# Patient Record
Sex: Female | Born: 1945 | Race: White | Hispanic: No | Marital: Married | Smoking: Former smoker
Health system: Southern US, Community
[De-identification: ages and names within clinical notes are randomized; demographics above are authoritative.]

## PROBLEM LIST (undated history)

## (undated) DIAGNOSIS — R002 Palpitations: Secondary | ICD-10-CM

## (undated) DIAGNOSIS — J449 Chronic obstructive pulmonary disease, unspecified: Secondary | ICD-10-CM

## (undated) DIAGNOSIS — E559 Vitamin D deficiency, unspecified: Secondary | ICD-10-CM

## (undated) DIAGNOSIS — G4733 Obstructive sleep apnea (adult) (pediatric): Secondary | ICD-10-CM

## (undated) DIAGNOSIS — M1990 Unspecified osteoarthritis, unspecified site: Secondary | ICD-10-CM

## (undated) DIAGNOSIS — M797 Fibromyalgia: Secondary | ICD-10-CM

## (undated) DIAGNOSIS — D1803 Hemangioma of intra-abdominal structures: Secondary | ICD-10-CM

## (undated) DIAGNOSIS — E785 Hyperlipidemia, unspecified: Secondary | ICD-10-CM

## (undated) DIAGNOSIS — G971 Other reaction to spinal and lumbar puncture: Secondary | ICD-10-CM

## (undated) DIAGNOSIS — K760 Fatty (change of) liver, not elsewhere classified: Secondary | ICD-10-CM

## (undated) DIAGNOSIS — G4730 Sleep apnea, unspecified: Secondary | ICD-10-CM

## (undated) DIAGNOSIS — N3090 Cystitis, unspecified without hematuria: Secondary | ICD-10-CM

## (undated) DIAGNOSIS — I4510 Unspecified right bundle-branch block: Secondary | ICD-10-CM

## (undated) DIAGNOSIS — T4145XA Adverse effect of unspecified anesthetic, initial encounter: Secondary | ICD-10-CM

## (undated) DIAGNOSIS — K219 Gastro-esophageal reflux disease without esophagitis: Secondary | ICD-10-CM

## (undated) DIAGNOSIS — Z8489 Family history of other specified conditions: Secondary | ICD-10-CM

## (undated) DIAGNOSIS — R7303 Prediabetes: Secondary | ICD-10-CM

## (undated) DIAGNOSIS — F419 Anxiety disorder, unspecified: Secondary | ICD-10-CM

## (undated) DIAGNOSIS — N83201 Unspecified ovarian cyst, right side: Secondary | ICD-10-CM

## (undated) DIAGNOSIS — T8859XA Other complications of anesthesia, initial encounter: Secondary | ICD-10-CM

## (undated) DIAGNOSIS — Z91014 Allergy to mammalian meats: Secondary | ICD-10-CM

## (undated) DIAGNOSIS — I10 Essential (primary) hypertension: Secondary | ICD-10-CM

## (undated) DIAGNOSIS — J302 Other seasonal allergic rhinitis: Secondary | ICD-10-CM

## (undated) DIAGNOSIS — K635 Polyp of colon: Secondary | ICD-10-CM

## (undated) DIAGNOSIS — F32A Depression, unspecified: Secondary | ICD-10-CM

## (undated) DIAGNOSIS — R001 Bradycardia, unspecified: Secondary | ICD-10-CM

## (undated) DIAGNOSIS — I2510 Atherosclerotic heart disease of native coronary artery without angina pectoris: Secondary | ICD-10-CM

## (undated) DIAGNOSIS — K2950 Unspecified chronic gastritis without bleeding: Secondary | ICD-10-CM

## (undated) DIAGNOSIS — I700 Atherosclerosis of aorta: Secondary | ICD-10-CM

## (undated) DIAGNOSIS — J45909 Unspecified asthma, uncomplicated: Secondary | ICD-10-CM

## (undated) HISTORY — PX: OTHER SURGICAL HISTORY: SHX169

## (undated) HISTORY — PX: BREAST BIOPSY: SHX20

## (undated) HISTORY — PX: DILATION AND CURETTAGE OF UTERUS: SHX78

## (undated) HISTORY — PX: NEUROMA SURGERY: SHX722

## (undated) HISTORY — PX: CHOLECYSTECTOMY: SHX55

## (undated) HISTORY — PX: HAND SURGERY: SHX662

---
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
## 2005-03-18 ENCOUNTER — Ambulatory Visit: Payer: Self-pay | Admitting: Unknown Physician Specialty

## 2008-07-24 ENCOUNTER — Ambulatory Visit: Payer: Self-pay | Admitting: Family Medicine

## 2008-07-30 ENCOUNTER — Ambulatory Visit: Payer: Self-pay | Admitting: Family Medicine

## 2008-08-08 ENCOUNTER — Ambulatory Visit: Payer: Self-pay | Admitting: Family Medicine

## 2009-07-13 ENCOUNTER — Ambulatory Visit: Payer: Self-pay | Admitting: Family Medicine

## 2010-06-04 ENCOUNTER — Ambulatory Visit: Payer: Self-pay | Admitting: Unknown Physician Specialty

## 2010-09-23 ENCOUNTER — Emergency Department: Payer: Self-pay | Admitting: Emergency Medicine

## 2011-11-23 ENCOUNTER — Ambulatory Visit: Payer: Self-pay | Admitting: Internal Medicine

## 2012-05-30 ENCOUNTER — Ambulatory Visit: Payer: Self-pay | Admitting: Internal Medicine

## 2012-06-01 ENCOUNTER — Ambulatory Visit: Payer: Self-pay | Admitting: Internal Medicine

## 2014-06-26 ENCOUNTER — Ambulatory Visit: Payer: Self-pay | Admitting: Internal Medicine

## 2014-07-04 ENCOUNTER — Ambulatory Visit: Payer: Self-pay | Admitting: Internal Medicine

## 2014-07-09 ENCOUNTER — Ambulatory Visit: Payer: Self-pay | Admitting: Internal Medicine

## 2014-12-08 ENCOUNTER — Ambulatory Visit: Payer: Self-pay | Admitting: Internal Medicine

## 2015-03-18 ENCOUNTER — Other Ambulatory Visit: Payer: Self-pay | Admitting: Internal Medicine

## 2015-03-18 DIAGNOSIS — R102 Pelvic and perineal pain: Secondary | ICD-10-CM

## 2015-03-25 ENCOUNTER — Ambulatory Visit
Admission: RE | Admit: 2015-03-25 | Discharge: 2015-03-25 | Disposition: A | Discharge status: Home / Self Care | Payer: Medicare Other | Source: Ambulatory Visit | Attending: Internal Medicine | Admitting: Internal Medicine

## 2015-03-25 DIAGNOSIS — D259 Leiomyoma of uterus, unspecified: Secondary | ICD-10-CM | POA: Diagnosis not present

## 2015-03-25 DIAGNOSIS — N9489 Other specified conditions associated with female genital organs and menstrual cycle: Secondary | ICD-10-CM | POA: Insufficient documentation

## 2015-03-25 DIAGNOSIS — R102 Pelvic and perineal pain: Secondary | ICD-10-CM | POA: Diagnosis present

## 2015-05-22 ENCOUNTER — Other Ambulatory Visit
Admission: RE | Admit: 2015-05-22 | Discharge: 2015-05-22 | Disposition: A | Discharge status: Home / Self Care | Payer: Medicare Other | Source: Ambulatory Visit | Attending: Unknown Physician Specialty | Admitting: Unknown Physician Specialty

## 2015-05-22 DIAGNOSIS — R197 Diarrhea, unspecified: Secondary | ICD-10-CM | POA: Diagnosis present

## 2015-05-22 LAB — C DIFFICILE QUICK SCREEN W PCR REFLEX
C DIFFICLE (CDIFF) ANTIGEN: NEGATIVE
C Diff interpretation: NEGATIVE
C Diff toxin: NEGATIVE

## 2015-05-24 LAB — STOOL CULTURE

## 2015-05-27 LAB — GIARDIA, EIA; OVA/PARASITE: Giardia Ag, Stl: NEGATIVE

## 2015-05-27 LAB — O&P RESULT

## 2015-06-12 ENCOUNTER — Encounter: Payer: Self-pay | Admitting: *Deleted

## 2015-06-15 ENCOUNTER — Encounter
Admission: RE | Disposition: A | Discharge status: Home / Self Care | Payer: Self-pay | Source: Ambulatory Visit | Attending: Unknown Physician Specialty

## 2015-06-15 ENCOUNTER — Ambulatory Visit: Payer: Medicare Other | Admitting: Anesthesiology

## 2015-06-15 ENCOUNTER — Ambulatory Visit
Admission: RE | Admit: 2015-06-15 | Discharge: 2015-06-15 | Disposition: A | Discharge status: Home / Self Care | Payer: Medicare Other | Source: Ambulatory Visit | Attending: Unknown Physician Specialty | Admitting: Unknown Physician Specialty

## 2015-06-15 DIAGNOSIS — K529 Noninfective gastroenteritis and colitis, unspecified: Secondary | ICD-10-CM | POA: Insufficient documentation

## 2015-06-15 DIAGNOSIS — Z87891 Personal history of nicotine dependence: Secondary | ICD-10-CM | POA: Insufficient documentation

## 2015-06-15 DIAGNOSIS — D125 Benign neoplasm of sigmoid colon: Secondary | ICD-10-CM | POA: Diagnosis not present

## 2015-06-15 DIAGNOSIS — D122 Benign neoplasm of ascending colon: Secondary | ICD-10-CM | POA: Diagnosis not present

## 2015-06-15 DIAGNOSIS — M1990 Unspecified osteoarthritis, unspecified site: Secondary | ICD-10-CM | POA: Diagnosis not present

## 2015-06-15 DIAGNOSIS — I10 Essential (primary) hypertension: Secondary | ICD-10-CM | POA: Diagnosis not present

## 2015-06-15 DIAGNOSIS — K640 First degree hemorrhoids: Secondary | ICD-10-CM | POA: Diagnosis not present

## 2015-06-15 DIAGNOSIS — Z79899 Other long term (current) drug therapy: Secondary | ICD-10-CM | POA: Insufficient documentation

## 2015-06-15 DIAGNOSIS — J45909 Unspecified asthma, uncomplicated: Secondary | ICD-10-CM | POA: Insufficient documentation

## 2015-06-15 HISTORY — DX: Unspecified asthma, uncomplicated: J45.909

## 2015-06-15 HISTORY — DX: Unspecified osteoarthritis, unspecified site: M19.90

## 2015-06-15 HISTORY — DX: Essential (primary) hypertension: I10

## 2015-06-15 HISTORY — PX: COLONOSCOPY WITH PROPOFOL: SHX5780

## 2015-06-15 SURGERY — COLONOSCOPY WITH PROPOFOL
Anesthesia: General

## 2015-06-15 MED ORDER — SODIUM CHLORIDE 0.9 % IV SOLN
INTRAVENOUS | Status: DC
  Administered 2015-06-15: 16:00:00 via INTRAVENOUS

## 2015-06-15 MED ORDER — SODIUM CHLORIDE 0.9 % IV SOLN
INTRAVENOUS | Status: DC
  Administered 2015-06-15: 1000 mL via INTRAVENOUS

## 2015-06-15 MED ORDER — ONDANSETRON HCL 4 MG/2ML IJ SOLN
INTRAMUSCULAR | Status: AC
  Administered 2015-06-15: 4 mg
  Filled 2015-06-15: qty 2

## 2015-06-15 MED ORDER — PROMETHAZINE HCL 25 MG/ML IJ SOLN
INTRAMUSCULAR | Status: AC
  Filled 2015-06-15: qty 1

## 2015-06-15 MED ORDER — ONDANSETRON HCL 4 MG/2ML IJ SOLN
INTRAMUSCULAR | Status: AC
  Filled 2015-06-15: qty 2

## 2015-06-15 MED ORDER — PROPOFOL 10 MG/ML IV BOLUS
INTRAVENOUS | Status: DC | PRN
  Administered 2015-06-15: 20 mg via INTRAVENOUS

## 2015-06-15 MED ORDER — FENTANYL CITRATE (PF) 100 MCG/2ML IJ SOLN
INTRAMUSCULAR | Status: DC | PRN
  Administered 2015-06-15: 25 ug via INTRAVENOUS
  Administered 2015-06-15: 50 ug via INTRAVENOUS
  Administered 2015-06-15: 25 ug via INTRAVENOUS
  Administered 2015-06-15: 100 ug via INTRAVENOUS

## 2015-06-15 MED ORDER — PROPOFOL INFUSION 10 MG/ML OPTIME
INTRAVENOUS | Status: DC | PRN
  Administered 2015-06-15: 50 ug/kg/min via INTRAVENOUS

## 2015-06-15 MED ORDER — PHENYLEPHRINE HCL 10 MG/ML IJ SOLN
INTRAMUSCULAR | Status: DC | PRN
  Administered 2015-06-15 (×5): 50 ug via INTRAVENOUS

## 2015-06-15 MED ORDER — MIDAZOLAM HCL 2 MG/2ML IJ SOLN
INTRAMUSCULAR | Status: DC | PRN
  Administered 2015-06-15: 0.5 mg via INTRAVENOUS
  Administered 2015-06-15: 1 mg via INTRAVENOUS
  Administered 2015-06-15: 0.5 mg via INTRAVENOUS
  Administered 2015-06-15: 2 mg via INTRAVENOUS

## 2015-06-15 NOTE — Transfer of Care (Signed)
Immediate Anesthesia Transfer of Care Note  Patient: Sarah Harrison  Procedure(s) Performed: Procedure(s): COLONOSCOPY WITH PROPOFOL (N/A)  Patient Location: PACU  Anesthesia Type:General  Level of Consciousness: awake, alert  and oriented  Airway & Oxygen Therapy: Patient connected to nasal cannula oxygen  Post-op Assessment: Report given to RN, Post -op Vital signs reviewed and stable and Patient moving all extremities  Post vital signs: Reviewed and stable  Last Vitals:  Filed Vitals:   06/15/15 1311  BP: 106/56  Pulse: 93  Temp: 36.7 C  Resp: 20    Complications: No apparent anesthesia complications

## 2015-06-15 NOTE — H&P (Signed)
   Primary Care Physician:  Idelle Crouch, MD Primary Gastroenterologist:  Dr. Vira Agar  Pre-Procedure History & Physical: HPI:  Sarah Harrison is a 69 y.o. female is here for an colonoscopy.   Past Medical History  Diagnosis Date  . Asthma   . Arthritis   . Hypertension     Past Surgical History  Procedure Laterality Date  . Cholecystectomy      Prior to Admission medications   Medication Sig Start Date End Date Taking? Authorizing Provider  albuterol (PROVENTIL HFA;VENTOLIN HFA) 108 (90 BASE) MCG/ACT inhaler Inhale 2 puffs into the lungs every 6 (six) hours as needed for wheezing or shortness of breath.   Yes Historical Provider, MD  budesonide (PULMICORT) 0.25 MG/2ML nebulizer solution Take 0.25 mg by nebulization 2 (two) times daily.   Yes Historical Provider, MD  calcium carbonate (TUMS EX) 750 MG chewable tablet Chew 1 tablet by mouth daily.   Yes Historical Provider, MD  Cholecalciferol 2000 UNITS CAPS Take 2,000 Units by mouth daily.   Yes Historical Provider, MD  EPINEPHrine (EPIPEN 2-PAK) 0.3 mg/0.3 mL IJ SOAJ injection Inject 0.3 mg into the muscle once. USE AS DIRECTED   Yes Historical Provider, MD  lisinopril-hydrochlorothiazide (PRINZIDE,ZESTORETIC) 10-12.5 MG per tablet Take 1 tablet by mouth daily.   Yes Historical Provider, MD    Allergies as of 04/15/2015  . (Not on File)    History reviewed. No pertinent family history.  Social History   Social History  . Marital Status: Married    Spouse Name: N/A  . Number of Children: N/A  . Years of Education: N/A   Occupational History  . Not on file.   Social History Main Topics  . Smoking status: Never Smoker   . Smokeless tobacco: Never Used  . Alcohol Use: 8.4 oz/week    7 Glasses of wine, 7 Shots of liquor per week  . Drug Use: No  . Sexual Activity: Not on file   Other Topics Concern  . Not on file   Social History Narrative  . No narrative on file    Review of Systems: See HPI,  otherwise negative ROS  Physical Exam: BP 106/56 mmHg  Pulse 93  Temp(Src) 98 F (36.7 C) (Oral)  Resp 20  Ht 5\' 4"  (1.626 m)  Wt 67.586 kg (149 lb)  BMI 25.56 kg/m2  SpO2 100% General:   Alert,  pleasant and cooperative in NAD Head:  Normocephalic and atraumatic. Neck:  Supple; no masses or thyromegaly. Lungs:  Clear throughout to auscultation.    Heart:  Regular rate and rhythm. Abdomen:  Soft, nontender and nondistended. Normal bowel sounds, without guarding, and without rebound.   Neurologic:  Alert and  oriented x4;  grossly normal neurologically.  Impression/Plan: Sarah Harrison is here for an colonoscopy to be performed for chronic diarrhea  Risks, benefits, limitations, and alternatives regarding  colonoscopy have been reviewed with the patient.  Questions have been answered.  All parties agreeable.   Gaylyn Cheers, MD  06/15/2015, 2:30 PM

## 2015-06-15 NOTE — Op Note (Signed)
[facility] Gastroenterology Patient Name: Sarah Harrison Procedure Date: 06/15/2015 2:25 PM MRN: [id number] Account #: [id number] Date of Birth: Dec 27, 1945 Admit Type: Outpatient Age: 69 Room: [facility] ENDO ROOM 1 Gender: Female Note Status: Finalized Procedure:         Colonoscopy Indications:       Chronic diarrhea, Clinically significant diarrhea of                     unexplained origin Providers:         Manya Silvas, MD Referring MD:      Leonie Douglas. Doy Hutching, MD (Referring MD) Medicines:         Propofol per Anesthesia Complications:     No immediate complications. Procedure:         Pre-Anesthesia Assessment:                    - After reviewing the risks and benefits, the patient was                     deemed in satisfactory condition to undergo the procedure.                    After obtaining informed consent, the colonoscope was                     passed under direct vision. Throughout the procedure, the                     patient's blood pressure, pulse, and oxygen saturations                     were monitored continuously. The Colonoscope was                     introduced through the anus and advanced to the the cecum,                     identified by appendiceal orifice and ileocecal valve. The                     colonoscopy was somewhat difficult due to restricted                     mobility of the colon. Successful completion of the                     procedure was aided by applying abdominal pressure. The                     patient tolerated the procedure well. The quality of the                     bowel preparation was excellent. Findings:      Three sessile polyps were found in the sigmoid colon and in the       ascending colon. The polyps were diminutive in size. These polyps were       removed with a cold biopsy forceps. Resection and retrieval were       complete.      Internal hemorrhoids were found during endoscopy. The  hemorrhoids were       small and Grade I (internal hemorrhoids that do not prolapse).      The exam was otherwise without abnormality. Due tto  chronic diarrhea I       biopsied the ascending, transverse, descending,, sigmod and rectum Impression:        - Three diminutive polyps in the sigmoid colon and in the                     ascending colon. Resected and retrieved.                    - Internal hemorrhoids.                    - The examination was otherwise normal. Recommendation:    - Await pathology results. Manya Silvas, MD 06/15/2015 3:06:24 PM This report has been signed electronically. Number of Addenda: 0 Note Initiated On: 06/15/2015 2:25 PM Scope Withdrawal Time: 0 hours 10 minutes 1 second  Total Procedure Duration: 0 hours 26 minutes 34 seconds       [facility]

## 2015-06-15 NOTE — Anesthesia Postprocedure Evaluation (Signed)
  Anesthesia Post-op Note  Patient: Sarah Harrison  Procedure(s) Performed: Procedure(s): COLONOSCOPY WITH PROPOFOL (N/A)  Anesthesia type:General  Patient location: PACU  Post pain: Pain level controlled  Post assessment: Post-op Vital signs reviewed, Patient's Cardiovascular Status Stable, Respiratory Function Stable, Patent Airway and No signs of Nausea or vomiting  Post vital signs: Reviewed and stable  Last Vitals:  Filed Vitals:   06/15/15 1555  BP: 148/66  Pulse:   Temp:   Resp:     Level of consciousness: awake, alert  and patient cooperative  Complications: No apparent anesthesia complications

## 2015-06-15 NOTE — Anesthesia Preprocedure Evaluation (Signed)
Anesthesia Evaluation  Patient identified by MRN, date of birth, ID band Patient awake    Reviewed: Allergy & Precautions, NPO status , Patient's Chart, lab work & pertinent test results  Airway Mallampati: III       Dental no notable dental hx.    Pulmonary asthma , former smoker,    Pulmonary exam normal       Cardiovascular hypertension, Pt. on medications Normal cardiovascular exam    Neuro/Psych    GI/Hepatic negative GI ROS, Neg liver ROS,   Endo/Other    Renal/GU negative Renal ROS     Musculoskeletal   Abdominal Normal abdominal exam  (+)   Peds  Hematology negative hematology ROS (+)   Anesthesia Other Findings   Reproductive/Obstetrics                             Anesthesia Physical Anesthesia Plan  ASA: II  Anesthesia Plan: General   Post-op Pain Management:    Induction: Intravenous  Airway Management Planned: Nasal Cannula  Additional Equipment:   Intra-op Plan:   Post-operative Plan:   Informed Consent: I have reviewed the patients History and Physical, chart, labs and discussed the procedure including the risks, benefits and alternatives for the proposed anesthesia with the patient or authorized representative who has indicated his/her understanding and acceptance.     Plan Discussed with: CRNA  Anesthesia Plan Comments:         Anesthesia Quick Evaluation

## 2015-06-16 ENCOUNTER — Encounter: Payer: Self-pay | Admitting: Unknown Physician Specialty

## 2015-06-17 LAB — SURGICAL PATHOLOGY

## 2016-04-11 DIAGNOSIS — N83201 Unspecified ovarian cyst, right side: Secondary | ICD-10-CM | POA: Insufficient documentation

## 2017-06-12 ENCOUNTER — Other Ambulatory Visit: Payer: Self-pay | Admitting: Internal Medicine

## 2017-06-12 DIAGNOSIS — E559 Vitamin D deficiency, unspecified: Secondary | ICD-10-CM | POA: Insufficient documentation

## 2017-06-12 DIAGNOSIS — R079 Chest pain, unspecified: Secondary | ICD-10-CM

## 2017-06-19 ENCOUNTER — Ambulatory Visit
Admission: RE | Admit: 2017-06-19 | Discharge: 2017-06-19 | Disposition: A | Discharge status: Home / Self Care | Payer: Medicare Other | Source: Ambulatory Visit | Attending: Internal Medicine | Admitting: Internal Medicine

## 2017-06-19 DIAGNOSIS — R079 Chest pain, unspecified: Secondary | ICD-10-CM | POA: Diagnosis present

## 2017-06-19 DIAGNOSIS — I700 Atherosclerosis of aorta: Secondary | ICD-10-CM | POA: Insufficient documentation

## 2017-06-19 DIAGNOSIS — D1803 Hemangioma of intra-abdominal structures: Secondary | ICD-10-CM | POA: Diagnosis not present

## 2017-06-19 DIAGNOSIS — R932 Abnormal findings on diagnostic imaging of liver and biliary tract: Secondary | ICD-10-CM | POA: Diagnosis not present

## 2017-06-19 MED ORDER — IOPAMIDOL (ISOVUE-370) INJECTION 76%
100.0000 mL | Freq: Once | INTRAVENOUS | Status: AC | PRN
  Administered 2017-06-19: 100 mL via INTRAVENOUS

## 2017-09-28 DIAGNOSIS — I10 Essential (primary) hypertension: Secondary | ICD-10-CM | POA: Insufficient documentation

## 2017-11-06 ENCOUNTER — Encounter: Payer: Self-pay | Admitting: Pulmonary Disease

## 2017-11-06 ENCOUNTER — Ambulatory Visit: Payer: Medicare Other | Admitting: Pulmonary Disease

## 2017-11-06 VITALS — BP 124/80 | HR 71 | Resp 16 | Ht 64.0 in | Wt 160.0 lb

## 2017-11-06 DIAGNOSIS — R053 Chronic cough: Secondary | ICD-10-CM

## 2017-11-06 DIAGNOSIS — Z87891 Personal history of nicotine dependence: Secondary | ICD-10-CM | POA: Diagnosis not present

## 2017-11-06 DIAGNOSIS — Z8709 Personal history of other diseases of the respiratory system: Secondary | ICD-10-CM

## 2017-11-06 DIAGNOSIS — R911 Solitary pulmonary nodule: Secondary | ICD-10-CM

## 2017-11-06 DIAGNOSIS — R05 Cough: Secondary | ICD-10-CM

## 2017-11-06 DIAGNOSIS — K219 Gastro-esophageal reflux disease without esophagitis: Secondary | ICD-10-CM | POA: Diagnosis not present

## 2017-11-06 DIAGNOSIS — R918 Other nonspecific abnormal finding of lung field: Secondary | ICD-10-CM

## 2017-11-06 MED ORDER — HYDROCOD POLST-CPM POLST ER 10-8 MG/5ML PO SUER: 5.0000 mL | Freq: Two times a day (BID) | ORAL | 0 refills | Status: DC | PRN

## 2017-11-06 MED ORDER — BENZONATATE 200 MG PO CAPS: 200.0000 mg | ORAL_CAPSULE | Freq: Three times a day (TID) | ORAL | 5 refills | Status: DC | PRN

## 2017-11-06 MED ORDER — BUDESONIDE-FORMOTEROL FUMARATE 160-4.5 MCG/ACT IN AERO: 2.0000 | INHALATION_SPRAY | Freq: Two times a day (BID) | RESPIRATORY_TRACT | 12 refills | Status: DC

## 2017-11-06 NOTE — Progress Notes (Signed)
PULMONARY CONSULT NOTE  Requesting MD/Service: Self, primary MD: Sparks Date of initial consultation: 11/06/17 Reason for consultation: Cough  PT PROFILE: 72 y.o. female never smoker self-referred for cough of 3 months duration   DATA: 06/19/17 CTA chest: No PE. Four mm right lower lobe pulmonary nodule, new since 2015   HPI:  As above. She initially developed a cough in Sept 2018 with an URI like illness (malaise, subjective fever, cough with mucus production).  All of the symptoms resolved except persistent cough.  She has been seen by Dr. Doy Hutching on a couple of occasions and has been prescribed 2 courses of antibiotics and prednisone.  She is also been prescribed 2 different inhalers.  Presently, her cough is worse at night.  On the day prior to this encounter she had no cough at all.  She does have intermittent hoarseness.  She describes her cough is rattling but she is not expectorating any mucus.  She denies hemoptysis.  She describes a foul taste in in her throat and mouth.  She denies fever and chest pain.  She has intermittent chest tightness.  She was on an ACE inhibitor but this was discontinued approximately 2 months ago with no improvement in her cough.  She has been seen by ENT and underwent reportedly laryngoscopy which was normal.  She is a former smoker and quit many years ago  (1987).  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  Many years ago she was seen by Dr. Ileene Musa and at that time was treated for possible asthma.  She also has a history of pneumonia as a child.  Past Medical History:  Diagnosis Date  . Arthritis   . Asthma   . Hypertension     Past Surgical History:  Procedure Laterality Date  . CHOLECYSTECTOMY    . COLONOSCOPY WITH PROPOFOL N/A 06/15/2015   Procedure: COLONOSCOPY WITH PROPOFOL;  Surgeon: Manya Silvas, MD;  Location: [facility];  Service: Endoscopy;  Laterality: N/A;    MEDICATIONS: I have  reviewed all medications and confirmed regimen as documented  Social History   Socioeconomic History  . Marital status: Married    Spouse name: Not on file  . Number of children: Not on file  . Years of education: Not on file  . Highest education level: Not on file  Social Needs  . Financial resource strain: Not on file  . Food insecurity - worry: Not on file  . Food insecurity - inability: Not on file  . Transportation needs - medical: Not on file  . Transportation needs - non-medical: Not on file  Occupational History  . Not on file  Tobacco Use  . Smoking status: Never Smoker  . Smokeless tobacco: Never Used  Substance and Sexual Activity  . Alcohol use: Yes    Alcohol/week: 8.4 oz    Types: 7 Glasses of wine, 7 Shots of liquor per week  . Drug use: No  . Sexual activity: Not on file  Other Topics Concern  . Not on file  Social History Narrative  . Not on file    History reviewed. No pertinent family history.  ROS: No fever, myalgias/arthralgias, unexplained weight loss or weight gain No new focal weakness or sensory deficits No otalgia, hearing loss, visual changes, nasal and sinus symptoms, mouth and throat problems No neck pain or adenopathy No abdominal pain, N/V/D, diarrhea, change in bowel pattern No dysuria, change in urinary pattern   Vitals:  11/06/17 1330 11/06/17 1333  BP:  124/80  Pulse:  71  Resp: 16   SpO2:  98%  Weight: 72.6 kg (160 lb)   Height: 5\' 4"  (1.626 m)   Room air   EXAM:  Gen: WDWN in NAD HEENT: NCAT, sclerae white, oropharynx normal Neck: No LAN, no JVD noted Lungs: full BS, normal percussion note throughout, no adventitious sounds Cardiovascular: Regular, normal rate, no M noted Abdomen: Soft, NT, +BS Ext: no C/C/E Neuro: PERRL, EOMI, motor/sensory grossly intact Skin: No lesions noted    DATA:   No flowsheet data found.  No flowsheet data found.  CXR: None available  IMPRESSION:     ICD-10-CM   1. Chronic  cough R05 Pulmonary Function Test [facility] Only  2. Former smoker Z87.891 Pulmonary Function Test [facility] Only  3. Lung nodule R91.1   4. GERD K21.9   5. ? history of asthma Z87.09 Pulmonary Function Test [facility] Only    Suspect cyclical component to cough  PLAN:  -Continue Spiriva and omeprazole as currently prescribed -I have added Symbicort inhaler, 2 actuations twice daily. Aggressive cough suppression as follows: -Tussionex-1 teaspoon at bedtime for 5 days, then every 12 hours as needed after that -Tessalon Perles, 200 mg every 4 hours as needed for cough -Follow-up in 4 weeks with PFTs prior to that visit -A CT chest has been ordered to follow the incidental finding of RLL nodule noted on CT chest in August 2018   Merton Border, MD PCCM service Mobile [phone] Pager [phone] 11/09/2017 10:51 PM

## 2017-11-06 NOTE — Patient Instructions (Signed)
Continue Spiriva and omeprazole as currently prescribed We will add Symbicort inhaler -2 actuations twice a day.  Rinse mouth after use Aggressive cough suppression as follows: Tussionex -use 1 teaspoon at bedtime for 5 straight nights and may use once a day in addition to this as needed  Then use every 12 hours as needed for cough Tessalon Perles -use every 4 hours as needed for cough  Repeat CT chest ordered Lung function tests (PFTs) ordered  Follow-up in 4 weeks

## 2017-11-07 ENCOUNTER — Telehealth: Payer: Self-pay | Admitting: Pulmonary Disease

## 2017-11-07 NOTE — Telephone Encounter (Signed)
Message left

## 2017-11-07 NOTE — Telephone Encounter (Signed)
Yes, absolutely, emphatically yes!!  Sarah Harrison

## 2017-11-07 NOTE — Telephone Encounter (Signed)
Patient wants to know if it is ok to take the pneumonia vaccine   Please call

## 2017-11-14 ENCOUNTER — Ambulatory Visit
Admission: RE | Admit: 2017-11-14 | Discharge: 2017-11-14 | Disposition: A | Discharge status: Home / Self Care | Payer: Medicare Other | Source: Ambulatory Visit | Attending: Pulmonary Disease | Admitting: Pulmonary Disease

## 2017-11-14 ENCOUNTER — Ambulatory Visit: Payer: Medicare Other | Attending: Pulmonary Disease

## 2017-11-14 DIAGNOSIS — Z87891 Personal history of nicotine dependence: Secondary | ICD-10-CM

## 2017-11-14 DIAGNOSIS — R911 Solitary pulmonary nodule: Secondary | ICD-10-CM | POA: Insufficient documentation

## 2017-11-14 DIAGNOSIS — I700 Atherosclerosis of aorta: Secondary | ICD-10-CM | POA: Diagnosis not present

## 2017-11-14 DIAGNOSIS — Z8709 Personal history of other diseases of the respiratory system: Secondary | ICD-10-CM

## 2017-11-14 DIAGNOSIS — R05 Cough: Secondary | ICD-10-CM | POA: Insufficient documentation

## 2017-11-14 DIAGNOSIS — R918 Other nonspecific abnormal finding of lung field: Secondary | ICD-10-CM

## 2017-11-14 DIAGNOSIS — R053 Chronic cough: Secondary | ICD-10-CM

## 2017-11-24 ENCOUNTER — Ambulatory Visit: Payer: Medicare Other | Admitting: Internal Medicine

## 2017-11-24 ENCOUNTER — Encounter: Payer: Self-pay | Admitting: Pulmonary Disease

## 2017-11-24 VITALS — BP 118/66 | HR 80 | Ht 64.0 in | Wt 161.0 lb

## 2017-11-24 DIAGNOSIS — R053 Chronic cough: Secondary | ICD-10-CM

## 2017-11-24 DIAGNOSIS — R918 Other nonspecific abnormal finding of lung field: Secondary | ICD-10-CM | POA: Diagnosis not present

## 2017-11-24 DIAGNOSIS — R911 Solitary pulmonary nodule: Secondary | ICD-10-CM | POA: Diagnosis not present

## 2017-11-24 DIAGNOSIS — R05 Cough: Secondary | ICD-10-CM

## 2017-11-24 NOTE — Patient Instructions (Signed)
-  Continue to use Astelin nasal spray for sinus symptoms, if this does not work, switch to [facility].  - We will check a CT chest in approximately 1 year, follow-up with Dr. Alva Garnet after that.  -In the meantime call us back with any further respiratory symptoms.  You can stay off of your inhalers.

## 2017-11-24 NOTE — Progress Notes (Signed)
Requesting MD/Service: Self, primary MD: Doy Hutching Date of initial consultation: 11/06/17 Reason for consultation: Cough  PT PROFILE: 72 y.o. female never smoker self-referred for cough of 3 months duration   DATA: 06/19/17 CTA chest: No PE. Four mm right lower lobe pulmonary nodule, new since 2015   HPI:  Patient coming in as a follow-up from her recent appointment with Dr. Alva Garnet for chronic cough.  She had been started on Spiriva and Symbicort, when she saw Dr. Alva Garnet she was given anti-cough medications including Tessalon and Tussionex.  She did not notice any significant improvement in the cough, she was subsequently sent for CT chest due to nodule, and pulmonary function testing.  These tests notes showed no significant abnormality. In the interim she decided to "stop everything" including her cough medicines and her inhalers.  She noted that the cough immediately went away and has not come back since that time.  She continues to have some sinus symptoms with sinus pressure on her right cheek bone, she has no other particular complaints at this time.  Imaging personally reviewed, CT chest 11/14/17, normal lungs, redemonstration of previously seen right lower lobe nodule which is not significantly changed. -Review of pulmonary function testing; normal lung functions, without evidence of COPD or emphysema.  MEDICATIONS: I have reviewed all medications and confirmed regimen as documented  Social History   Socioeconomic History  . Marital status: Married    Spouse name: Not on file  . Number of children: Not on file  . Years of education: Not on file  . Highest education level: Not on file  Social Needs  . Financial resource strain: Not on file  . Food insecurity - worry: Not on file  . Food insecurity - inability: Not on file  . Transportation needs - medical: Not on file  . Transportation needs - non-medical: Not on file  Occupational History  . Not on file  Tobacco Use  .  Smoking status: Never Smoker  . Smokeless tobacco: Never Used  Substance and Sexual Activity  . Alcohol use: Yes    Alcohol/week: 8.4 oz    Types: 7 Glasses of wine, 7 Shots of liquor per week  . Drug use: No  . Sexual activity: Not on file  Other Topics Concern  . Not on file  Social History Narrative  . Not on file    History reviewed. No pertinent family history.  ROS: No fever, myalgias/arthralgias, unexplained weight loss or weight gain No new focal weakness or sensory deficits No otalgia, hearing loss, visual changes, nasal symptoms, mouth and throat problems No neck pain or adenopathy No abdominal pain, N/V/D, diarrhea, change in bowel pattern No dysuria, change in urinary pattern   Vitals:   11/24/17 0839 11/24/17 0844  BP:  118/66  Pulse:  80  SpO2:  97%  Weight: 161 lb (73 kg)   Height: 5\' 4"  (1.626 m)   Room air   EXAM:  Gen: WDWN in NAD HEENT: NCAT, sclerae white, oropharynx normal Neck: No LAN, no JVD noted Lungs: full BS, normal percussion note throughout, no adventitious sounds Cardiovascular: Regular, normal rate, no M noted Abdomen: Soft, NT, +BS Ext: no C/C/E Neuro: PERRL, EOMI, motor/sensory grossly intact Skin: No lesions noted    DATA:   No flowsheet data found.  No flowsheet data found.  CXR: None available  IMPRESSION:     ICD-10-CM   1. Chronic cough R05   2. Former smoker Z87.891   3. Lung nodule R91.1  CT chest in 1 year.   4. GERD K21.9   5. ? history of asthma D22.02     Suspect cyclical component to cough  PLAN:  -Cough appears to have resolved after stopping inhalers, will continue to hold them.  Continue to hold anticough medications including Tessalon and Tussionex as it did not appear to help. -CT chest continues to show small right lower lobe nodule, will repeat in 1 year, and follow-up with Dr. Alva Garnet after that. - Symptoms of sinus pressure today which could be a sinus infection.  We discussed that this is most  likely viral, continue to monitor for 1 week if not improved in 1 week call us back or ENT.   Marda Stalker, MD.   Board Certified in Internal Medicine, Pulmonary Medicine, [place], and Sleep Medicine.  [facility] Office Number: [phone] Pager: [phone]  Patricia Pesa, M.D.  Merton Border, M.D  11/24/2017 9:03 AM

## 2018-11-28 ENCOUNTER — Ambulatory Visit
Admission: RE | Admit: 2018-11-28 | Discharge: 2018-11-28 | Disposition: A | Discharge status: Home / Self Care | Payer: Medicare Other | Source: Ambulatory Visit | Attending: Internal Medicine | Admitting: Internal Medicine

## 2018-11-28 DIAGNOSIS — R918 Other nonspecific abnormal finding of lung field: Secondary | ICD-10-CM | POA: Diagnosis present

## 2018-11-29 ENCOUNTER — Ambulatory Visit: Admission: RE | Admit: 2018-11-29 | Payer: Medicare Other | Source: Ambulatory Visit

## 2018-12-03 ENCOUNTER — Ambulatory Visit: Payer: Medicare Other | Admitting: Pulmonary Disease

## 2018-12-03 ENCOUNTER — Encounter: Payer: Self-pay | Admitting: Pulmonary Disease

## 2018-12-03 VITALS — BP 116/60 | HR 76 | Resp 16 | Ht 64.0 in | Wt 169.0 lb

## 2018-12-03 DIAGNOSIS — K219 Gastro-esophageal reflux disease without esophagitis: Secondary | ICD-10-CM | POA: Insufficient documentation

## 2018-12-03 DIAGNOSIS — R05 Cough: Secondary | ICD-10-CM | POA: Diagnosis not present

## 2018-12-03 DIAGNOSIS — J45909 Unspecified asthma, uncomplicated: Secondary | ICD-10-CM | POA: Insufficient documentation

## 2018-12-03 DIAGNOSIS — J302 Other seasonal allergic rhinitis: Secondary | ICD-10-CM | POA: Insufficient documentation

## 2018-12-03 DIAGNOSIS — R030 Elevated blood-pressure reading, without diagnosis of hypertension: Secondary | ICD-10-CM

## 2018-12-03 DIAGNOSIS — F32A Depression, unspecified: Secondary | ICD-10-CM | POA: Insufficient documentation

## 2018-12-03 DIAGNOSIS — M797 Fibromyalgia: Secondary | ICD-10-CM | POA: Insufficient documentation

## 2018-12-03 DIAGNOSIS — F329 Major depressive disorder, single episode, unspecified: Secondary | ICD-10-CM | POA: Insufficient documentation

## 2018-12-03 DIAGNOSIS — Z87891 Personal history of nicotine dependence: Secondary | ICD-10-CM | POA: Diagnosis not present

## 2018-12-03 DIAGNOSIS — R911 Solitary pulmonary nodule: Secondary | ICD-10-CM

## 2018-12-03 DIAGNOSIS — N3090 Cystitis, unspecified without hematuria: Secondary | ICD-10-CM | POA: Insufficient documentation

## 2018-12-03 DIAGNOSIS — R059 Cough, unspecified: Secondary | ICD-10-CM

## 2018-12-03 DIAGNOSIS — E785 Hyperlipidemia, unspecified: Secondary | ICD-10-CM | POA: Insufficient documentation

## 2018-12-03 DIAGNOSIS — IMO0001 Reserved for inherently not codable concepts without codable children: Secondary | ICD-10-CM | POA: Insufficient documentation

## 2018-12-03 NOTE — Patient Instructions (Signed)
No further follow-up of lung nodule is warranted Follow-up in this office only as needed for any breathing, chest or lung problems including recurrence of cough

## 2018-12-06 NOTE — Progress Notes (Signed)
PULMONARY CONSULT NOTE  Requesting MD/Service: Self, primary MD: Sparks Date of initial consultation: 11/06/17 Reason for consultation: Cough  PT PROFILE: 73 y.o. female never smoker self-referred for cough of 3 months duration   DATA: 06/19/17 CTA chest: No PE. Four mm right lower lobe pulmonary nodule, new since 2015 11/14/17 CT chest: Stable 4 mm nodule in right lower lobe  11/28/18 CT chest: Stable right-sided pulmonary nodules, the largest measuring 4 mm in the right posterior basal segment. Given stability over the course of 1 year, no additional follow-up recommended  INTERVAL: Last seen 11/06/2017.  No major pulmonary events in the interim   SUBJ: This is a scheduled follow-up.  She has no new complaints.  Since last visit, her cough has resolved completely.  She is here to review repeat CT scan of the chest which is documented above.  She is presently on no inhalers.  She is no longer on omeprazole.    Vitals:   12/03/18 1418 12/03/18 1424  BP:  116/60  Pulse:  76  Resp: 16   SpO2:  98%  Weight: 169 lb (76.7 kg)   Height: 5\' 4"  (1.626 m)   Room air   EXAM:  Gen: NAD HEENT: NCAT, sclera white Neck: No JVD Lungs: breath sounds full, no wheezes or other adventitious sounds Cardiovascular: RRR, no murmurs Abdomen: Soft, nontender, normal BS Ext: without clubbing, cyanosis, edema Neuro: grossly intact Skin: Limited exam, no lesions noted   DATA:   No flowsheet data found.  No flowsheet data found.  CXR: None available  IMPRESSION:     ICD-10-CM   1. Former smoker Z87.891   2. Lung nodule, stable over 17 month interval. Deemed benign R91.1   3. Cough, resolved R05      PLAN:  No further follow-up of pulmonary nodule is warranted She is to follow-up in this office as needed for any respiratory problems including recurrence of cough   Merton Border, MD PCCM service Mobile [phone] Pager [phone] 12/06/2018 12:26 PM

## 2019-12-05 ENCOUNTER — Other Ambulatory Visit: Payer: Self-pay

## 2019-12-05 ENCOUNTER — Telehealth: Payer: Self-pay

## 2019-12-05 DIAGNOSIS — Z1211 Encounter for screening for malignant neoplasm of colon: Secondary | ICD-10-CM

## 2019-12-05 NOTE — Telephone Encounter (Signed)
Gastroenterology Pre-Procedure Review  Request Date: 12/30/19 Requesting Physician: Dr. Allen Norris  PATIENT REVIEW QUESTIONS: The patient responded to the following health history questions as indicated:    1. Are you having any GI issues? yes (IBS-D) 2. Do you have a personal history of Polyps? yes (2016 with dr. Vira Agar) 3. Do you have a family history of Colon Cancer or Polyps? no 4. Diabetes Mellitus? no 5. Joint replacements in the past 12 months?no 6. Major health problems in the past 3 months?no 7. Any artificial heart valves, MVP, or defibrillator?no    MEDICATIONS & ALLERGIES:    Patient reports the following regarding taking any anticoagulation/antiplatelet therapy:   Plavix, Coumadin, Eliquis, Xarelto, Lovenox, Pradaxa, Brilinta, or Effient? no Aspirin? no  Patient confirms/reports the following medications:  Current Outpatient Medications  Medication Sig Dispense Refill  . albuterol (PROVENTIL HFA;VENTOLIN HFA) 108 (90 BASE) MCG/ACT inhaler Inhale 2 puffs into the lungs every 6 (six) hours as needed for wheezing or shortness of breath.    . bisoprolol-hydrochlorothiazide (ZIAC) 5-6.25 MG tablet Take 1 tablet by mouth daily.    . calcium carbonate (TUMS EX) 750 MG chewable tablet Chew 1 tablet by mouth daily.    . Cholecalciferol 2000 UNITS CAPS Take 2,000 Units by mouth daily.    . Cyanocobalamin (VITAMIN B 12 PO) Take 1 tablet by mouth daily.    Marland Kitchen EPINEPHrine (EPIPEN 2-PAK) 0.3 mg/0.3 mL IJ SOAJ injection Inject 0.3 mg into the muscle once. USE AS DIRECTED     No current facility-administered medications for this visit.    Patient confirms/reports the following allergies:  Allergies  Allergen Reactions  . Penicillins Anaphylaxis  . Sudafed [Pseudoephedrine Hcl]   . Sulfur     No orders of the defined types were placed in this encounter.   AUTHORIZATION INFORMATION Primary Insurance: 1D#: Group #:  Secondary Insurance: 1D#: Group #:  SCHEDULE  INFORMATION: Date: 12/30/19 Time: Location:[facility]

## 2019-12-26 ENCOUNTER — Other Ambulatory Visit
Admission: RE | Admit: 2019-12-26 | Discharge: 2019-12-26 | Disposition: A | Discharge status: Home / Self Care | Payer: Medicare PPO | Source: Ambulatory Visit | Attending: Gastroenterology | Admitting: Gastroenterology

## 2019-12-26 ENCOUNTER — Other Ambulatory Visit: Payer: Self-pay

## 2019-12-26 DIAGNOSIS — Z20822 Contact with and (suspected) exposure to covid-19: Secondary | ICD-10-CM | POA: Insufficient documentation

## 2019-12-26 DIAGNOSIS — Z01812 Encounter for preprocedural laboratory examination: Secondary | ICD-10-CM | POA: Insufficient documentation

## 2019-12-26 LAB — SARS CORONAVIRUS 2 (TAT 6-24 HRS): SARS Coronavirus 2: NEGATIVE

## 2019-12-30 ENCOUNTER — Ambulatory Visit
Admission: RE | Admit: 2019-12-30 | Discharge: 2019-12-30 | Disposition: A | Discharge status: Home / Self Care | Payer: Medicare PPO | Attending: Gastroenterology | Admitting: Gastroenterology

## 2019-12-30 ENCOUNTER — Ambulatory Visit: Payer: Medicare PPO | Admitting: Anesthesiology

## 2019-12-30 ENCOUNTER — Encounter: Payer: Self-pay | Admitting: Gastroenterology

## 2019-12-30 ENCOUNTER — Other Ambulatory Visit: Payer: Self-pay

## 2019-12-30 ENCOUNTER — Encounter
Admission: RE | Disposition: A | Discharge status: Home / Self Care | Payer: Self-pay | Source: Home / Self Care | Attending: Gastroenterology

## 2019-12-30 DIAGNOSIS — Z1211 Encounter for screening for malignant neoplasm of colon: Secondary | ICD-10-CM | POA: Diagnosis not present

## 2019-12-30 DIAGNOSIS — K5140 Inflammatory polyps of colon without complications: Secondary | ICD-10-CM | POA: Insufficient documentation

## 2019-12-30 DIAGNOSIS — D120 Benign neoplasm of cecum: Secondary | ICD-10-CM | POA: Diagnosis not present

## 2019-12-30 DIAGNOSIS — I10 Essential (primary) hypertension: Secondary | ICD-10-CM | POA: Insufficient documentation

## 2019-12-30 DIAGNOSIS — Z860100 Personal history of colon polyps, unspecified: Secondary | ICD-10-CM

## 2019-12-30 DIAGNOSIS — M1990 Unspecified osteoarthritis, unspecified site: Secondary | ICD-10-CM | POA: Diagnosis not present

## 2019-12-30 DIAGNOSIS — Z87891 Personal history of nicotine dependence: Secondary | ICD-10-CM | POA: Insufficient documentation

## 2019-12-30 DIAGNOSIS — Z880 Allergy status to penicillin: Secondary | ICD-10-CM | POA: Insufficient documentation

## 2019-12-30 DIAGNOSIS — J45909 Unspecified asthma, uncomplicated: Secondary | ICD-10-CM | POA: Insufficient documentation

## 2019-12-30 DIAGNOSIS — Z888 Allergy status to other drugs, medicaments and biological substances status: Secondary | ICD-10-CM | POA: Diagnosis not present

## 2019-12-30 DIAGNOSIS — K640 First degree hemorrhoids: Secondary | ICD-10-CM | POA: Diagnosis not present

## 2019-12-30 HISTORY — DX: Other complications of anesthesia, initial encounter: T88.59XA

## 2019-12-30 HISTORY — PX: COLONOSCOPY WITH PROPOFOL: SHX5780

## 2019-12-30 SURGERY — COLONOSCOPY WITH PROPOFOL
Anesthesia: General

## 2019-12-30 MED ORDER — PHENYLEPHRINE HCL (PRESSORS) 10 MG/ML IV SOLN
INTRAVENOUS | Status: AC
  Filled 2019-12-30: qty 1

## 2019-12-30 MED ORDER — SODIUM CHLORIDE 0.9 % IV SOLN: INTRAVENOUS | Status: DC

## 2019-12-30 MED ORDER — PROPOFOL 500 MG/50ML IV EMUL
INTRAVENOUS | Status: DC | PRN
  Administered 2019-12-30: 120 ug/kg/min via INTRAVENOUS

## 2019-12-30 MED ORDER — PROPOFOL 10 MG/ML IV BOLUS
INTRAVENOUS | Status: DC | PRN
  Administered 2019-12-30: 90 mg via INTRAVENOUS

## 2019-12-30 MED ORDER — PROPOFOL 500 MG/50ML IV EMUL
INTRAVENOUS | Status: AC
  Filled 2019-12-30: qty 50

## 2019-12-30 MED ORDER — GLYCOPYRROLATE 0.2 MG/ML IJ SOLN
INTRAMUSCULAR | Status: AC
  Filled 2019-12-30: qty 1

## 2019-12-30 NOTE — Op Note (Signed)
[facility] Patient Name: Sarah Harrison Procedure Date: 12/30/2019 7:47 AM MRN: [id number] Account #: [id number] Date of Birth: [birth year] Admit Type: Outpatient Age: 74 Room: [facility] ENDO ROOM 4 Gender: Female Note Status: Finalized Procedure:             Colonoscopy Indications:           High risk colon cancer surveillance: Personal history                         of colonic polyps Providers:             Lucilla Lame MD, MD Referring MD:          Leonie Douglas. Doy Hutching, MD (Referring MD) Medicines:             Propofol per Anesthesia Complications:         No immediate complications. Procedure:             Pre-Anesthesia Assessment:                        - Prior to the procedure, a History and Physical was                         performed, and patient medications and allergies were                         reviewed. The patient's tolerance of previous                         anesthesia was also reviewed. The risks and benefits                         of the procedure and the sedation options and risks                         were discussed with the patient. All questions were                         answered, and informed consent was obtained. Prior                         Anticoagulants: The patient has taken no previous                         anticoagulant or antiplatelet agents. ASA Grade                         Assessment: II - A patient with mild systemic disease.                         After reviewing the risks and benefits, the patient                         was deemed in satisfactory condition to undergo the                         procedure.  After obtaining informed consent, the colonoscope was                         passed under direct vision. Throughout the procedure,                         the patient's blood pressure, pulse, and oxygen                         saturations were monitored continuously. The                    Colonoscope was introduced through the anus and                         advanced to the the cecum, identified by appendiceal                         orifice and ileocecal valve. The colonoscopy was                         performed without difficulty. The patient tolerated                         the procedure well. The quality of the bowel                         preparation was excellent. Findings:      The perianal and digital rectal examinations were normal.      Two sessile polyps were found in the cecum. The polyps were 2 to 3 mm in       size. These polyps were removed with a cold biopsy forceps. Resection       and retrieval were complete.      Non-bleeding internal hemorrhoids were found during retroflexion. The       hemorrhoids were Grade I (internal hemorrhoids that do not prolapse). Impression:            - Two 2 to 3 mm polyps in the cecum, removed with a                         cold biopsy forceps. Resected and retrieved.                        - Non-bleeding internal hemorrhoids. Recommendation:        - Discharge patient to home.                        - Resume previous diet.                        - Continue present medications.                        - Await pathology results.                        - Repeat colonoscopy in 5 years for surveillance. Procedure Code(s):     --- Professional ---  45380, Colonoscopy, flexible; with biopsy, single or                         multiple Diagnosis Code(s):     --- Professional ---                        Z86.010, Personal history of colonic polyps                        K63.5, Polyp of colon CPT copyright 2019 American Medical Association. All rights reserved. The codes documented in this report are preliminary and upon coder review may  be revised to meet current compliance requirements. Lucilla Lame MD, MD 12/30/2019 8:08:47 AM This report has been signed electronically. Number of Addenda:  0 Note Initiated On: 12/30/2019 7:47 AM Scope Withdrawal Time: 0 hours 6 minutes 37 seconds  Total Procedure Duration: 0 hours 9 minutes 51 seconds  Estimated Blood Loss:  Estimated blood loss: none.      [facility]

## 2019-12-30 NOTE — Anesthesia Preprocedure Evaluation (Signed)
Anesthesia Evaluation  Patient identified by MRN, date of birth, ID band Patient awake    Reviewed: Allergy & Precautions, H&P , NPO status , Patient's Chart, lab work & pertinent test results, reviewed documented beta blocker date and time   History of Anesthesia Complications (+) Emergence Delirium and history of anesthetic complications  Airway Mallampati: II   Neck ROM: full    Dental  (+) Poor Dentition   Pulmonary asthma , former smoker,    Pulmonary exam normal        Cardiovascular Exercise Tolerance: Poor hypertension, On Medications negative cardio ROS Normal cardiovascular exam Rhythm:regular Rate:Normal     Neuro/Psych PSYCHIATRIC DISORDERS Depression  Neuromuscular disease    GI/Hepatic Neg liver ROS, GERD  Medicated,  Endo/Other  negative endocrine ROS  Renal/GU negative Renal ROS  negative genitourinary   Musculoskeletal   Abdominal   Peds  Hematology negative hematology ROS (+)   Anesthesia Other Findings Past Medical History: No date: Arthritis No date: Asthma No date: Complication of anesthesia No date: Hypertension Past Surgical History: No date: CESAREAN SECTION No date: CHOLECYSTECTOMY 06/15/2015: COLONOSCOPY WITH PROPOFOL; N/A     Comment:  Procedure: COLONOSCOPY WITH PROPOFOL;  Surgeon: Manya Silvas, MD;  Location: [facility] ENDOSCOPY;  Service:               Endoscopy;  Laterality: N/A; BMI    Body Mass Index: 28.01 kg/m     Reproductive/Obstetrics negative OB ROS                             Anesthesia Physical Anesthesia Plan  ASA: III  Anesthesia Plan: General   Post-op Pain Management:    Induction:   PONV Risk Score and Plan:   Airway Management Planned:   Additional Equipment:   Intra-op Plan:   Post-operative Plan:   Informed Consent: I have reviewed the patients History and Physical, chart, labs and discussed the  procedure including the risks, benefits and alternatives for the proposed anesthesia with the patient or authorized representative who has indicated his/her understanding and acceptance.     Dental Advisory Given  Plan Discussed with: CRNA  Anesthesia Plan Comments:         Anesthesia Quick Evaluation

## 2019-12-30 NOTE — H&P (Signed)
Lucilla Lame, MD [address] [place], [place] [address] [phone] Fax : [phone]  Primary Care Physician:  Idelle Crouch, MD Primary Gastroenterologist:  Dr. Allen Norris  Pre-Procedure History & Physical: HPI:  Sarah Harrison is a 74 y.o. female is here for an colonoscopy.   Past Medical History:  Diagnosis Date  . Arthritis   . Asthma   . Complication of anesthesia   . Hypertension     Past Surgical History:  Procedure Laterality Date  . CESAREAN SECTION    . CHOLECYSTECTOMY    . COLONOSCOPY WITH PROPOFOL N/A 06/15/2015   Procedure: COLONOSCOPY WITH PROPOFOL;  Surgeon: Manya Silvas, MD;  Location: [facility] ENDOSCOPY;  Service: Endoscopy;  Laterality: N/A;    Prior to Admission medications   Medication Sig Start Date End Date Taking? Authorizing Provider  calcium carbonate (TUMS EX) 750 MG chewable tablet Chew 1 tablet by mouth daily.   Yes [provider]  Cholecalciferol 2000 UNITS CAPS Take 2,000 Units by mouth daily.   Yes [provider]  Cyanocobalamin (VITAMIN B 12 PO) Take 1 tablet by mouth daily.   Yes [provider]  albuterol (PROVENTIL HFA;VENTOLIN HFA) 108 (90 BASE) MCG/ACT inhaler Inhale 2 puffs into the lungs every 6 (six) hours as needed for wheezing or shortness of breath.    [provider]  bisoprolol-hydrochlorothiazide (ZIAC) 5-6.25 MG tablet Take 1 tablet by mouth daily. 09/28/17 12/03/18  [provider]  EPINEPHrine (EPIPEN 2-PAK) 0.3 mg/0.3 mL IJ SOAJ injection Inject 0.3 mg into the muscle once. USE AS DIRECTED    [provider]    Allergies as of 12/05/2019 - Review Complete 12/03/2018  Allergen Reaction Noted  . Penicillins Anaphylaxis 06/15/2015  . Sudafed [pseudoephedrine hcl]  06/15/2015  . Sulfur  06/15/2015    History reviewed. No pertinent family history.  Social History   Socioeconomic History  . Marital status: Married    Spouse name: Not on file    . Number of children: Not on file  . Years of education: Not on file  . Highest education level: Not on file  Occupational History  . Not on file  Tobacco Use  . Smoking status: Former Smoker    Types: Cigarettes    Quit date: 10/31/1986    Years since quitting: 33.1  . Smokeless tobacco: Never Used  Substance and Sexual Activity  . Alcohol use: Yes    Alcohol/week: 14.0 standard drinks    Types: 7 Glasses of wine, 7 Shots of liquor per week    Comment: OCC  . Drug use: No  . Sexual activity: Not on file  Other Topics Concern  . Not on file  Social History Narrative  . Not on file   Social Determinants of Health   Financial Resource Strain:   . Difficulty of Paying Living Expenses: Not on file  Food Insecurity:   . Worried About Charity fundraiser in the Last Year: Not on file  . Ran Out of Food in the Last Year: Not on file  Transportation Needs:   . Lack of Transportation (Medical): Not on file  . Lack of Transportation (Non-Medical): Not on file  Physical Activity:   . Days of Exercise per Week: Not on file  . Minutes of Exercise per Session: Not on file  Stress:   . Feeling of Stress : Not on file  Social Connections:   . Frequency of Communication with Friends and Family: Not on  file  . Frequency of Social Gatherings with Friends and Family: Not on file  . Attends Religious Services: Not on file  . Active Member of Clubs or Organizations: Not on file  . Attends Archivist Meetings: Not on file  . Marital Status: Not on file  Intimate Partner Violence:   . Fear of Current or Ex-Partner: Not on file  . Emotionally Abused: Not on file  . Physically Abused: Not on file  . Sexually Abused: Not on file    Review of Systems: See HPI, otherwise negative ROS  Physical Exam: BP (!) 141/65   Pulse 68   Temp 97.6 F (36.4 C) (Temporal)   Resp 18   Ht 5\' 5"  (1.651 m)   Wt 76.4 kg   SpO2 99%   BMI 28.01 kg/m  General:   Alert,  pleasant and  cooperative in NAD Head:  Normocephalic and atraumatic. Neck:  Supple; no masses or thyromegaly. Lungs:  Clear throughout to auscultation.    Heart:  Regular rate and rhythm. Abdomen:  Soft, nontender and nondistended. Normal bowel sounds, without guarding, and without rebound.   Neurologic:  Alert and  oriented x4;  grossly normal neurologically.  Impression/Plan: Sarah Harrison is here for an colonoscopy to be performed for history of adenomatous polyps 06/15/2015  Risks, benefits, limitations, and alternatives regarding  colonoscopy have been reviewed with the patient.  Questions have been answered.  All parties agreeable.   Lucilla Lame, MD  12/30/2019, 7:42 AM

## 2019-12-30 NOTE — Transfer of Care (Signed)
Immediate Anesthesia Transfer of Care Note  Patient: Sarah Harrison  Procedure(s) Performed: COLONOSCOPY WITH PROPOFOL (N/A )  Patient Location: Endoscopy Unit  Anesthesia Type:General  Level of Consciousness: sedated and responds to stimulation  Airway & Oxygen Therapy: Patient Spontanous Breathing and Patient connected to nasal cannula oxygen  Post-op Assessment: Report given to RN and Post -op Vital signs reviewed and stable  Post vital signs: Reviewed and stable  Last Vitals:  Vitals Value Taken Time  BP 99/62 12/30/19 0812  Temp 36.3 C 12/30/19 0811  Pulse 63 12/30/19 0813  Resp 16 12/30/19 0813  SpO2 96 % 12/30/19 0813  Vitals shown include unvalidated device data.  Last Pain:  Vitals:   12/30/19 0811  TempSrc: Temporal  PainSc: Asleep         Complications: No apparent anesthesia complications

## 2019-12-31 ENCOUNTER — Encounter: Payer: Self-pay | Admitting: *Deleted

## 2019-12-31 LAB — SURGICAL PATHOLOGY

## 2020-01-01 ENCOUNTER — Encounter: Payer: Self-pay | Admitting: Gastroenterology

## 2020-01-01 NOTE — Anesthesia Postprocedure Evaluation (Signed)
Anesthesia Post Note  Patient: Sarah Harrison  Procedure(s) Performed: COLONOSCOPY WITH PROPOFOL (N/A )  Patient location during evaluation: PACU Anesthesia Type: General Level of consciousness: awake and alert Pain management: pain level controlled Vital Signs Assessment: post-procedure vital signs reviewed and stable Respiratory status: spontaneous breathing, nonlabored ventilation, respiratory function stable and patient connected to nasal cannula oxygen Cardiovascular status: blood pressure returned to baseline and stable Postop Assessment: no apparent nausea or vomiting Anesthetic complications: no     Last Vitals:  Vitals:   12/30/19 0831 12/30/19 0841  BP: (!) 117/56 (!) 110/93  Pulse:    Resp:    Temp:    SpO2:      Last Pain:  Vitals:   12/31/19 0731  TempSrc:   PainSc: 0-No pain                 Molli Barrows

## 2020-01-27 ENCOUNTER — Other Ambulatory Visit: Payer: Self-pay | Admitting: Physician Assistant

## 2020-01-27 DIAGNOSIS — R1011 Right upper quadrant pain: Secondary | ICD-10-CM

## 2020-01-31 ENCOUNTER — Ambulatory Visit
Admission: RE | Admit: 2020-01-31 | Discharge: 2020-01-31 | Disposition: A | Discharge status: Home / Self Care | Payer: Medicare PPO | Source: Ambulatory Visit | Attending: Physician Assistant | Admitting: Physician Assistant

## 2020-01-31 ENCOUNTER — Other Ambulatory Visit: Payer: Self-pay

## 2020-01-31 DIAGNOSIS — R1011 Right upper quadrant pain: Secondary | ICD-10-CM

## 2020-07-01 ENCOUNTER — Other Ambulatory Visit: Payer: Self-pay | Admitting: Internal Medicine

## 2020-07-01 DIAGNOSIS — Z1231 Encounter for screening mammogram for malignant neoplasm of breast: Secondary | ICD-10-CM

## 2020-10-01 ENCOUNTER — Other Ambulatory Visit: Payer: Self-pay | Admitting: Internal Medicine

## 2020-10-01 DIAGNOSIS — M25562 Pain in left knee: Secondary | ICD-10-CM

## 2020-10-06 ENCOUNTER — Ambulatory Visit: Payer: Medicare PPO

## 2021-06-01 ENCOUNTER — Other Ambulatory Visit: Payer: Self-pay | Admitting: Internal Medicine

## 2021-06-01 DIAGNOSIS — R1032 Left lower quadrant pain: Secondary | ICD-10-CM

## 2021-06-10 ENCOUNTER — Ambulatory Visit
Admission: RE | Admit: 2021-06-10 | Discharge: 2021-06-10 | Disposition: A | Discharge status: Home / Self Care | Payer: Medicare PPO | Source: Ambulatory Visit | Attending: Internal Medicine | Admitting: Internal Medicine

## 2021-06-10 ENCOUNTER — Other Ambulatory Visit: Payer: Self-pay

## 2021-06-10 DIAGNOSIS — R1032 Left lower quadrant pain: Secondary | ICD-10-CM | POA: Diagnosis present

## 2021-06-10 LAB — POCT I-STAT CREATININE: Creatinine, Ser: 0.8 mg/dL (ref 0.44–1.00)

## 2021-06-10 MED ORDER — IOHEXOL 350 MG/ML SOLN
100.0000 mL | Freq: Once | INTRAVENOUS | Status: AC | PRN
  Administered 2021-06-10: 100 mL via INTRAVENOUS

## 2021-06-16 ENCOUNTER — Ambulatory Visit: Payer: Medicare PPO

## 2021-06-28 ENCOUNTER — Other Ambulatory Visit: Payer: Self-pay | Admitting: Internal Medicine

## 2021-06-28 DIAGNOSIS — Z1231 Encounter for screening mammogram for malignant neoplasm of breast: Secondary | ICD-10-CM

## 2021-07-05 NOTE — Progress Notes (Signed)
07/06/21 9:55 AM   Varney Biles [birth year] [id number]  Referring provider:  Idelle Crouch, MD [address] [facility] [place],  [place] [address] Chief Complaint  Patient presents with   New Patient (Initial Visit)    Pelvic pain     HPI: Sarah Harrison is a 75 y.o.female who presents today for further evaluation of pelvic/suprapubic pain.  She was seen at [facility] by Dr. Fulton Reek on 05/31/2021 for left lower quadrant pain who ordered a CT abdomen pelvis with contrast. Urinalysis showed rare bacteria and moderate crystals but was otherwise unremarkable.   On 06/10/2021 she had a CT abdomen and pelvis with contrast scan that revealed no acute abnormality or findings to account for left lower quadrant pain.   Urinalysis today is negative.   She reports today that when she has to use the restroom she experiences pain until she uses it and then the pain eases.  This is primarily at nighttime.  The pain is primarily situated over her suprapubic area.  She describes the pain as similar to when she used to be on her period, dull ache.  She does think that when she started having this initially, is much worse than now but persist.  She reports she gets up once a night to urinate. She experiences no leakage but she does have bowel issues that are accompanied with constipation and diarrhea.  She denies any gross hematuria, dysuria, fevers, chills, nausea or vomiting. She is not sexually active.   Former smoker, smoked up to 2 packs a day for at least 5 years but stopped in the 55s.  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     PMH: Past Medical History:  Diagnosis Date   Arthritis    Asthma    Complication of anesthesia    Hypertension     Surgical History: Past Surgical History:  Procedure Laterality Date   CESAREAN SECTION     CHOLECYSTECTOMY     COLONOSCOPY WITH  PROPOFOL N/A 06/15/2015   Procedure: COLONOSCOPY WITH PROPOFOL;  Surgeon: Manya Silvas, MD;  Location: [facility] ENDOSCOPY;  Service: Endoscopy;  Laterality: N/A;   COLONOSCOPY WITH PROPOFOL N/A 12/30/2019   Procedure: COLONOSCOPY WITH PROPOFOL;  Surgeon: Lucilla Lame, MD;  Location: [facility] ENDOSCOPY;  Service: Endoscopy;  Laterality: N/A;   urethrel dialation     age 75    Home Medications:  Allergies as of 07/06/2021       Reactions   Penicillins Anaphylaxis   Elemental Sulfur    Sudafed [pseudoephedrine Hcl]         Medication List        Accurate as of July 06, 2021  9:55 AM. If you have any questions, ask your nurse or doctor.          albuterol 108 (90 Base) MCG/ACT inhaler Commonly known as: VENTOLIN HFA Inhale 2 puffs into the lungs every 6 (six) hours as needed for wheezing or shortness of breath.   bisoprolol-hydrochlorothiazide 5-6.25 MG tablet Commonly known as: ZIAC Take 1 tablet by mouth daily.   calcium carbonate 750 MG chewable tablet Commonly known as: TUMS EX Chew 1 tablet by mouth daily.   Cholecalciferol 50 MCG (2000 UT) Caps Take 2,000 Units by mouth daily.   EPINEPHrine 0.3 mg/0.3 mL Soaj injection Commonly known as: EPI-PEN Inject 0.3 mg into the muscle once. USE AS DIRECTED   VITAMIN  B 12 PO Take 1 tablet by mouth daily.        Allergies:  Allergies  Allergen Reactions   Penicillins Anaphylaxis   Elemental Sulfur    Sudafed [Pseudoephedrine Hcl]     Family History: No family history on file.  Social History:  reports that she quit smoking about 34 years ago. Her smoking use included cigarettes. She has never used smokeless tobacco. She reports current alcohol use of about 14.0 standard drinks per week. She reports that she does not use drugs.   Physical Exam: BP (!) 147/83   Pulse 73   Ht '5\' 5"'$  (1.651 m)   Wt 168 lb (76.2 kg)   BMI 27.96 kg/m   Constitutional:  Alert and oriented, No acute distress. HEENT: [place] AT,  moist mucus membranes.  Trachea midline, no masses. Cardiovascular: No clubbing, cyanosis, or edema. Respiratory: Normal respiratory effort, no increased work of breathing. Skin: No rashes, bruises or suspicious lesions. Neurologic: Grossly intact, no focal deficits, moving all 4 extremities. Psychiatric: Normal mood and affect.  Laboratory Data:  Lab Results  Component Value Date   CREATININE 0.80 06/10/2021      Urinalysis 07/06/2021 urinalysis negative 6-10 WBC, dip is otherwise negative   Pertinent Imaging: CLINICAL DATA:  Left lower quadrant pain   EXAM: CT ABDOMEN AND PELVIS WITH CONTRAST   TECHNIQUE: Multidetector CT imaging of the abdomen and pelvis was performed using the standard protocol following bolus administration of intravenous contrast.   CONTRAST:  184m OMNIPAQUE IOHEXOL 350 MG/ML SOLN   COMPARISON:  2009   FINDINGS: Lower chest: Lungs bases are clear.   Hepatobiliary: Low-attenuation lesions are identified in the right hepatic lobe. The more inferior lesion demonstrates enhancement pattern consistent with a hemangioma. More superior lesion was also characterized as a hemangioma on prior MRI abdomen. Post cholecystectomy. No biliary dilatation.   Pancreas: Unremarkable.   Spleen: Unremarkable.   Adrenals/Urinary Tract: Adrenals are unremarkable. Malrotation of the right kidney. Bladder is unremarkable.   Stomach/Bowel: Stomach is within normal limits. Bowel is normal in caliber. Normal appendix.   Vascular/Lymphatic: Aortic atherosclerosis. No enlarged lymph nodes.   Reproductive: Uterus and bilateral adnexa are unremarkable.   Other: No free fluid.  Abdominal wall is unremarkable.   Musculoskeletal: Degenerative changes of the included spine.   IMPRESSION: No acute abnormality or findings to account for reported symptoms.   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   Aortic atherosclerosis.     Electronically Signed   By: PMacy Mis M.D.   On: 06/11/2021 11:12   CT scan was personally reviewed.  There is no obvious GU abdominal or pelvic pathology on the scan.  Agree with radiologic interpretation.  Assessment & Plan:    Pelvic pain - Differently diagnosis interstial cystitis vs. non GU ideology vs significantly less likelyb ladder cancer - Remote heavy smoker and has a friend dying of cancer and would like cystoscopy to rule out this possibility which seems reasonable  - CT scan was negative for abnormalities  - Urinalysis negative  - Uribel prescribed to help in the interim to help with pelvic discomfort   Return for cysto.  I,Kailey Littlejohn,acting as a sEducation administratorfor AHollice Espy MD.,have documented all relevant documentation on the behalf of AHollice Espy MD,as directed by  AHollice Espy MD while in the presence of AHollice Espy [place] [place] [phone]

## 2021-07-06 ENCOUNTER — Encounter: Payer: Self-pay | Admitting: Urology

## 2021-07-06 ENCOUNTER — Ambulatory Visit: Payer: Medicare PPO | Admitting: Urology

## 2021-07-06 ENCOUNTER — Other Ambulatory Visit: Payer: Self-pay

## 2021-07-06 VITALS — BP 147/83 | HR 73 | Ht 65.0 in | Wt 168.0 lb

## 2021-07-06 DIAGNOSIS — R102 Pelvic and perineal pain: Secondary | ICD-10-CM

## 2021-07-06 LAB — URINALYSIS, COMPLETE
Bilirubin, UA: NEGATIVE
Glucose, UA: NEGATIVE
Ketones, UA: NEGATIVE
Leukocytes,UA: NEGATIVE
Nitrite, UA: NEGATIVE
Protein,UA: NEGATIVE
RBC, UA: NEGATIVE
Specific Gravity, UA: >1.03 — ABNORMAL HIGH (ref 1.005–1.030)
Urobilinogen, Ur: 0.2 mg/dL (ref 0.2–1.0)
pH, UA: 5 (ref 5.0–7.5)

## 2021-07-06 LAB — MICROSCOPIC EXAMINATION

## 2021-07-06 LAB — BLADDER SCAN AMB NON-IMAGING: Scan Result: 23

## 2021-07-06 MED ORDER — URIBEL 118 MG PO CAPS: 118.0000 mg | ORAL_CAPSULE | Freq: Four times a day (QID) | ORAL | 11 refills | Status: DC | PRN

## 2021-07-06 NOTE — Patient Instructions (Signed)
Cystoscopy Cystoscopy is a procedure that is used to help diagnose and sometimes treat conditions that affect the lower urinary tract. The lower urinary tract includes the bladder and the urethra. The urethra is the tube that drains urine from the bladder. Cystoscopy is done using a thin, tube-shaped instrument with a light and camera at the end (cystoscope). The cystoscope may be hard or flexible, depending on the goal of the procedure. The cystoscope is inserted through the urethra, into the bladder. Cystoscopy may be recommended if you have: Urinary tract infections that keep coming back. Blood in the urine (hematuria). An inability to control when you urinate (urinary incontinence) or an overactive bladder. Unusual cells found in a urine sample. A blockage in the urethra, such as a urinary stone. Painful urination. An abnormality in the bladder found during an intravenous pyelogram (IVP) or CT scan. Cystoscopy may also be done to remove a sample of tissue to be examined under a microscope (biopsy). What are the risks? Generally, this is a safe procedure. However, problems may occur, including: Infection. Bleeding.  What happens during the procedure?  You will be given one or more of the following: A medicine to numb the area (local anesthetic). The area around the opening of your urethra will be cleaned. The cystoscope will be passed through your urethra into your bladder. Germ-free (sterile) fluid will flow through the cystoscope to fill your bladder. The fluid will stretch your bladder so that your health care provider can clearly examine your bladder walls. Your doctor will look at the urethra and bladder. The cystoscope will be removed The procedure may vary among health care providers  What can I expect after the procedure? After the procedure, it is common to have: Some soreness or pain in your abdomen and urethra. Urinary symptoms. These include: Mild pain or burning when you  urinate. Pain should stop within a few minutes after you urinate. This may last for up to 1 week. A small amount of blood in your urine for several days. Feeling like you need to urinate but producing only a small amount of urine. Follow these instructions at home: General instructions Return to your normal activities as told by your health care provider.  Do not drive for 24 hours if you were given a sedative during your procedure. Watch for any blood in your urine. If the amount of blood in your urine increases, call your health care provider. If a tissue sample was removed for testing (biopsy) during your procedure, it is up to you to get your test results. Ask your health care provider, or the department that is doing the test, when your results will be ready. Drink enough fluid to keep your urine pale yellow. Keep all follow-up visits as told by your health care provider. This is important. Contact a health care provider if you: Have pain that gets worse or does not get better with medicine, especially pain when you urinate. Have trouble urinating. Have more blood in your urine. Get help right away if you: Have blood clots in your urine. Have abdominal pain. Have a fever or chills. Are unable to urinate. Summary Cystoscopy is a procedure that is used to help diagnose and sometimes treat conditions that affect the lower urinary tract. Cystoscopy is done using a thin, tube-shaped instrument with a light and camera at the end. After the procedure, it is common to have some soreness or pain in your abdomen and urethra. Watch for any blood in your urine.   If the amount of blood in your urine increases, call your health care provider. If you were prescribed an antibiotic medicine, take it as told by your health care provider. Do not stop taking the antibiotic even if you start to feel better. This information is not intended to replace advice given to you by your health care provider. Make  sure you discuss any questions you have with your health care provider. Document Revised: 10/09/2018 Document Reviewed: 10/09/2018 Elsevier Patient Education  2020 [employer]  

## 2021-07-27 NOTE — Progress Notes (Signed)
   07/28/21  CC:  Chief Complaint  Patient presents with   Cysto     HPI: Sarah Harrison is a 75 y.o.female with a personal history of acute pelvic/suprapubic pain, who presents today for a cystoscopy.   She also has a history of hemorrhagic cystitis and underwent urethral dilation.   CT abdomen pelvis with contrast on 06/10/2021 for LLQ pain revealed no acute abnormality findings to account for left lower quadrant pain.   She is a former smoker, smoked up to 2 packs a day for at least 5 years but stopped in the 15s.   She reports today that she had a Gynecological visit and was found to have what sounds like lateral fascia tenderness on pelvic exam.  SHe is considering PT referral.  Vitals:   07/28/21 0933  BP: (!) 155/73  Pulse: (!) 44  NED. A&Ox3.   No respiratory distress   Abd soft, NT, ND Normal external genitalia with patent urethral meatus  Cystoscopy Procedure Note  Patient identification was confirmed, informed consent was obtained, and patient was prepped using Betadine solution.  Lidocaine jelly was administered per urethral meatus.    Procedure: - Flexible cystoscope introduced, without any difficulty.   - Thorough search of the bladder revealed:    normal urethral meatus    normal urothelium    no stones    no ulcers     no tumors    no urethral polyps    no trabeculation  - Ureteral orifices were normal in position and appearance.  Post-Procedure: - Patient tolerated the procedure well   Assessment/ Plan:  Pelvic pain  -  Cystoscopy today was unremarkable.  -She was seen at Gynecology recently and was noted to have what sounds like lateral pelvic floor tenderness  - Agree with Gynecology with referral to pelvic floor therapy if this fails to improve     Return if symptoms worsen or fail to improve.  I,Kailey Littlejohn,acting as a Education administrator for Hollice Espy, MD.,have documented all relevant documentation on the behalf of Hollice Espy,  MD,as directed by  Hollice Espy, MD while in the presence of Hollice Espy, MD.   I have reviewed the above documentation for accuracy and completeness, and I agree with the above.   Hollice Espy, MD

## 2021-07-28 ENCOUNTER — Ambulatory Visit: Payer: Medicare PPO | Admitting: Urology

## 2021-07-28 ENCOUNTER — Other Ambulatory Visit: Payer: Self-pay

## 2021-07-28 ENCOUNTER — Encounter: Payer: Self-pay | Admitting: Urology

## 2021-07-28 VITALS — BP 155/73 | HR 57 | Ht 65.0 in | Wt 168.0 lb

## 2021-07-28 DIAGNOSIS — R102 Pelvic and perineal pain: Secondary | ICD-10-CM | POA: Diagnosis not present

## 2021-07-29 LAB — URINALYSIS, COMPLETE
Bilirubin, UA: NEGATIVE
Glucose, UA: NEGATIVE
Ketones, UA: NEGATIVE
Leukocytes,UA: NEGATIVE
Nitrite, UA: NEGATIVE
Protein,UA: NEGATIVE
RBC, UA: NEGATIVE
Specific Gravity, UA: >1.03 — ABNORMAL HIGH (ref 1.005–1.030)
Urobilinogen, Ur: 0.2 mg/dL (ref 0.2–1.0)
pH, UA: 5.5 (ref 5.0–7.5)

## 2021-07-29 LAB — MICROSCOPIC EXAMINATION: Bacteria, UA: NONE SEEN

## 2021-12-06 ENCOUNTER — Other Ambulatory Visit: Payer: Self-pay | Admitting: Internal Medicine

## 2021-12-06 DIAGNOSIS — R1084 Generalized abdominal pain: Secondary | ICD-10-CM

## 2021-12-20 ENCOUNTER — Ambulatory Visit: Admission: RE | Admit: 2021-12-20 | Payer: Medicare PPO | Source: Ambulatory Visit

## 2021-12-30 ENCOUNTER — Other Ambulatory Visit: Payer: Self-pay

## 2021-12-30 ENCOUNTER — Ambulatory Visit
Admission: RE | Admit: 2021-12-30 | Discharge: 2021-12-30 | Disposition: A | Discharge status: Home / Self Care | Payer: Medicare PPO | Source: Ambulatory Visit | Attending: Internal Medicine | Admitting: Internal Medicine

## 2021-12-30 DIAGNOSIS — R1084 Generalized abdominal pain: Secondary | ICD-10-CM | POA: Insufficient documentation

## 2021-12-30 LAB — POCT I-STAT CREATININE: Creatinine, Ser: 0.8 mg/dL (ref 0.44–1.00)

## 2021-12-30 MED ORDER — IOHEXOL 300 MG/ML  SOLN
100.0000 mL | Freq: Once | INTRAMUSCULAR | Status: AC | PRN
  Administered 2021-12-30: 100 mL via INTRAVENOUS

## 2022-02-10 ENCOUNTER — Encounter: Payer: Self-pay | Admitting: Gastroenterology

## 2022-02-10 NOTE — H&P (Signed)
? ?Pre-Procedure H&P ?  ?Patient ID: Sarah Harrison is a 76 y.o. female. ? ?Gastroenterology Provider: Annamaria Helling, DO ? ?Referring Provider: Dawson Bills, NP ?PCP: Idelle Crouch, MD ? ?Date: 02/11/2022 ? ?HPI ?Sarah Harrison is a 76 y.o. female who presents today for Esophagogastroduodenoscopy for chronic abdominal pain. ?Pt with COPD, Anxiety/Depression, liver hemangioma, gerd, epigastric pain, OSA on cpap, HTN, fibromylagia, cystitis with four months of chronic epigastric pain that has been intermittent and cramping. Epigastric ruq and side pain. This has been helped by both carfate and protonix. No n/v. ? ?Pt is able to directly point and lay a finger on area along her right side as to where one of the pain points is. This is not consistent with GI etiology. ?She does note discomfort in band like fashion across her upper abdomen improved by food. ? ?No dysphagia, odynophagia.  ? ?Also known h/o of IBS and pelvic floor dysfunction. ? ?S/p cholecystectomy and c section. Previous tobacco use ? ?Most recent labs a1c 6.6 hgb 13 mcv 96 plt 242. Cr 0.7 ?CT moderate distention stomach w/o wall thickening; panniculitis/sclerosing mesenteritis noted ? ?EGD 2011- dysphagia- small HH noted; dilated with 51 fr savary ?EGD 2006- gastritis and duodenitis; negative HP bx ? ?Colonoscopy 2005, 2006, 2011, 2016, 2021- adenomatous polyps in 2016 and 2021. Negative for microscopic colitis ? ?Past Medical History:  ?Diagnosis Date  ? Arthritis   ? Asthma   ? Complication of anesthesia   ? Hypertension   ? ? ?Past Surgical History:  ?Procedure Laterality Date  ? CESAREAN SECTION    ? CHOLECYSTECTOMY    ? COLONOSCOPY WITH PROPOFOL N/A 06/15/2015  ? Procedure: COLONOSCOPY WITH PROPOFOL;  Surgeon: Manya Silvas, MD;  Location: [facility] ENDOSCOPY;  Service: Endoscopy;  Laterality: N/A;  ? COLONOSCOPY WITH PROPOFOL N/A 12/30/2019  ? Procedure: COLONOSCOPY WITH PROPOFOL;  Surgeon: Lucilla Lame, MD;  Location: [facility]  ENDOSCOPY;  Service: Endoscopy;  Laterality: N/A;  ? urethrel dialation    ? age 59  ? ? ?Family History ?Parents- colon polyps ?No h/o GI disease or malignancy ? ?Review of Systems  ?Constitutional:  Negative for activity change, appetite change, chills, diaphoresis, fatigue, fever and unexpected weight change.  ?HENT:  Negative for trouble swallowing and voice change.   ?Respiratory:  Negative for shortness of breath and wheezing.   ?Cardiovascular:  Negative for chest pain, palpitations and leg swelling.  ?Gastrointestinal:  Positive for abdominal pain (chronic, epigastric). Negative for abdominal distention, anal bleeding, blood in stool, constipation, diarrhea, nausea, rectal pain and vomiting.  ?Musculoskeletal:  Negative for arthralgias and myalgias.  ?Skin:  Negative for color change and pallor.  ?Neurological:  Negative for dizziness, syncope and weakness.  ?Psychiatric/Behavioral:  Negative for confusion.   ?All other systems reviewed and are negative.  ? ?Medications ?No current facility-administered medications on file prior to encounter.  ? ?Current Outpatient Medications on File Prior to Encounter  ?Medication Sig Dispense Refill  ? calcium carbonate (TUMS EX) 750 MG chewable tablet Chew 1 tablet by mouth daily.    ? Cholecalciferol 2000 UNITS CAPS Take 2,000 Units by mouth daily.    ? Cyanocobalamin (VITAMIN B 12 PO) Take 1 tablet by mouth daily.    ? rosuvastatin (CRESTOR) 5 MG tablet Take 5 mg by mouth daily.    ? albuterol (PROVENTIL HFA;VENTOLIN HFA) 108 (90 BASE) MCG/ACT inhaler Inhale 2 puffs into the lungs every 6 (six) hours as needed for wheezing or shortness of breath.    ?  EPINEPHrine 0.3 mg/0.3 mL IJ SOAJ injection Inject 0.3 mg into the muscle once. USE AS DIRECTED    ? Meth-Hyo-M Bl-Na Phos-Ph Sal (URIBEL) 118 MG CAPS Take 1 capsule (118 mg total) by mouth 4 (four) times daily as needed. 60 capsule 11  ? traZODone (DESYREL) 50 MG tablet Take 1 tablet by mouth at bedtime.     ? ? ?Pertinent medications related to GI and procedure were reviewed by me with the patient prior to the procedure ? ? ?Current Facility-Administered Medications:  ?  0.9 %  sodium chloride infusion, , Intravenous, Continuous, Annamaria Helling, DO, Last Rate: 20 mL/hr at 02/11/22 0753, New Bag at 02/11/22 0753 ?  ?  ? ?Allergies  ?Allergen Reactions  ? Penicillins Anaphylaxis  ? Bee Venom Swelling  ? Elemental Sulfur   ? Sudafed [Pseudoephedrine Hcl]   ? ?Allergies were reviewed by me prior to the procedure ? ?Objective  ? ?Body mass index is 28.56 kg/m?. ?Vitals:  ? 02/11/22 0734  ?BP: 123/74  ?Pulse: (!) 56  ?Resp: 16  ?Temp: (!) 96.9 ?F (36.1 ?C)  ?TempSrc: Temporal  ?SpO2: 100%  ?Weight: 76.7 kg  ?Height: 5' 4.5" (1.638 m)  ? ? ? ?Physical Exam ?Vitals and nursing note reviewed.  ?Constitutional:   ?   General: She is not in acute distress. ?   Appearance: Normal appearance. She is not ill-appearing, toxic-appearing or diaphoretic.  ?HENT:  ?   Head: Normocephalic and atraumatic.  ?   Nose: Nose normal.  ?   Mouth/Throat:  ?   Mouth: Mucous membranes are moist.  ?   Pharynx: Oropharynx is clear.  ?Eyes:  ?   General: No scleral icterus. ?   Extraocular Movements: Extraocular movements intact.  ?Cardiovascular:  ?   Rate and Rhythm: Regular rhythm. Bradycardia present.  ?   Heart sounds: Normal heart sounds. No murmur heard. ?  No friction rub. No gallop.  ?Pulmonary:  ?   Effort: Pulmonary effort is normal. No respiratory distress.  ?   Breath sounds: Normal breath sounds. No wheezing, rhonchi or rales.  ?Abdominal:  ?   General: Bowel sounds are normal. There is no distension.  ?   Palpations: Abdomen is soft.  ?   Tenderness: There is no abdominal tenderness. There is no guarding or rebound.  ?Musculoskeletal:  ?   Cervical back: Neck supple.  ?   Right lower leg: No edema.  ?   Left lower leg: No edema.  ?Skin: ?   General: Skin is warm and dry.  ?   Coloration: Skin is not jaundiced or pale.   ?Neurological:  ?   General: No focal deficit present.  ?   Mental Status: She is alert and oriented to person, place, and time. Mental status is at baseline.  ?Psychiatric:     ?   Mood and Affect: Mood normal.     ?   Behavior: Behavior normal.     ?   Thought Content: Thought content normal.     ?   Judgment: Judgment normal.  ? ? ? ?Assessment:  ?Ms. NATASIA SANKO is a 76 y.o. female  who presents today for Esophagogastroduodenoscopy for Chronic abdominal pain. ? ?Plan:  ?Esophagogastroduodenoscopy with possible intervention today ? ?Esophagogastroduodenoscopy with possible biopsy, control of bleeding, polypectomy, and interventions as necessary has been discussed with the patient/patient representative. Informed consent was obtained from the patient/patient representative after explaining the indication, nature, and risks of the procedure including  but not limited to death, bleeding, perforation, missed neoplasm/lesions, cardiorespiratory compromise, and reaction to medications. Opportunity for questions was given and appropriate answers were provided. Patient/patient representative has verbalized understanding is amenable to undergoing the procedure. ? ? ?Annamaria Helling, DO  ?[facility] ? ?Portions of the record may have been created with voice recognition software. Occasional wrong-word or 'sound-a-like' substitutions may have occurred due to the inherent limitations of voice recognition software.  Read the chart carefully and recognize, using context, where substitutions may have occurred. ?

## 2022-02-11 ENCOUNTER — Encounter: Payer: Self-pay | Admitting: Gastroenterology

## 2022-02-11 ENCOUNTER — Ambulatory Visit: Payer: Medicare PPO | Admitting: Certified Registered Nurse Anesthetist

## 2022-02-11 ENCOUNTER — Ambulatory Visit
Admission: RE | Admit: 2022-02-11 | Discharge: 2022-02-11 | Disposition: A | Discharge status: Home / Self Care | Payer: Medicare PPO | Attending: Gastroenterology | Admitting: Gastroenterology

## 2022-02-11 ENCOUNTER — Encounter
Admission: RE | Disposition: A | Discharge status: Home / Self Care | Payer: Self-pay | Source: Home / Self Care | Attending: Gastroenterology

## 2022-02-11 DIAGNOSIS — K317 Polyp of stomach and duodenum: Secondary | ICD-10-CM | POA: Insufficient documentation

## 2022-02-11 DIAGNOSIS — Z87891 Personal history of nicotine dependence: Secondary | ICD-10-CM | POA: Insufficient documentation

## 2022-02-11 DIAGNOSIS — J449 Chronic obstructive pulmonary disease, unspecified: Secondary | ICD-10-CM | POA: Insufficient documentation

## 2022-02-11 DIAGNOSIS — K449 Diaphragmatic hernia without obstruction or gangrene: Secondary | ICD-10-CM | POA: Diagnosis not present

## 2022-02-11 DIAGNOSIS — M797 Fibromyalgia: Secondary | ICD-10-CM | POA: Insufficient documentation

## 2022-02-11 DIAGNOSIS — Z79899 Other long term (current) drug therapy: Secondary | ICD-10-CM | POA: Diagnosis not present

## 2022-02-11 DIAGNOSIS — G8929 Other chronic pain: Secondary | ICD-10-CM | POA: Diagnosis not present

## 2022-02-11 DIAGNOSIS — K219 Gastro-esophageal reflux disease without esophagitis: Secondary | ICD-10-CM | POA: Diagnosis not present

## 2022-02-11 DIAGNOSIS — K2970 Gastritis, unspecified, without bleeding: Secondary | ICD-10-CM | POA: Diagnosis not present

## 2022-02-11 DIAGNOSIS — I10 Essential (primary) hypertension: Secondary | ICD-10-CM | POA: Insufficient documentation

## 2022-02-11 DIAGNOSIS — G4733 Obstructive sleep apnea (adult) (pediatric): Secondary | ICD-10-CM | POA: Diagnosis not present

## 2022-02-11 DIAGNOSIS — K589 Irritable bowel syndrome without diarrhea: Secondary | ICD-10-CM | POA: Insufficient documentation

## 2022-02-11 DIAGNOSIS — F32A Depression, unspecified: Secondary | ICD-10-CM | POA: Diagnosis not present

## 2022-02-11 DIAGNOSIS — R1013 Epigastric pain: Secondary | ICD-10-CM | POA: Diagnosis present

## 2022-02-11 HISTORY — PX: ESOPHAGOGASTRODUODENOSCOPY: SHX5428

## 2022-02-11 SURGERY — EGD (ESOPHAGOGASTRODUODENOSCOPY)
Anesthesia: General

## 2022-02-11 MED ORDER — LIDOCAINE HCL (CARDIAC) PF 100 MG/5ML IV SOSY
PREFILLED_SYRINGE | INTRAVENOUS | Status: DC | PRN
  Administered 2022-02-11: 50 mg via INTRAVENOUS

## 2022-02-11 MED ORDER — LIDOCAINE HCL (PF) 2 % IJ SOLN
INTRAMUSCULAR | Status: AC
  Filled 2022-02-11: qty 5

## 2022-02-11 MED ORDER — SODIUM CHLORIDE 0.9 % IV SOLN: INTRAVENOUS | Status: DC

## 2022-02-11 MED ORDER — PROPOFOL 500 MG/50ML IV EMUL
INTRAVENOUS | Status: DC | PRN
Start: 2022-02-11 — End: 2022-02-11
  Administered 2022-02-11: 120 ug/kg/min via INTRAVENOUS

## 2022-02-11 MED ORDER — PROPOFOL 500 MG/50ML IV EMUL
INTRAVENOUS | Status: AC
  Filled 2022-02-11: qty 50

## 2022-02-11 MED ORDER — PROPOFOL 10 MG/ML IV BOLUS
INTRAVENOUS | Status: DC | PRN
  Administered 2022-02-11: 60 mg via INTRAVENOUS

## 2022-02-11 NOTE — Anesthesia Postprocedure Evaluation (Signed)
Anesthesia Post Note ? ?Patient: Sarah Harrison ? ?Procedure(s) Performed: ESOPHAGOGASTRODUODENOSCOPY (EGD) ? ?Patient location during evaluation: PACU ?Anesthesia Type: General ?Level of consciousness: awake and oriented ?Pain management: satisfactory to patient ?Vital Signs Assessment: post-procedure vital signs reviewed and stable ?Respiratory status: spontaneous breathing and respiratory function stable ?Cardiovascular status: stable ?Anesthetic complications: no ? ? ?No notable events documented. ? ? ?Last Vitals:  ?Vitals:  ? 02/11/22 0854 02/11/22 0914  ?BP: (!) 109/48 (!) 105/53  ?Pulse: 70   ?Resp: 20   ?Temp: (!) 36.2 ?C   ?SpO2: 94%   ?  ?Last Pain:  ?Vitals:  ? 02/11/22 0914  ?TempSrc:   ?PainSc: 0-No pain  ? ? ?  ?  ?  ?  ?  ?  ? ?VAN STAVEREN,Trishelle Devora ? ? ? ? ?

## 2022-02-11 NOTE — Anesthesia Preprocedure Evaluation (Signed)
Anesthesia Evaluation  ?Patient identified by MRN, date of birth, ID band ?Patient awake ? ? ? ?Reviewed: ?Allergy & Precautions, NPO status , Patient's Chart, lab work & pertinent test results ? ?Airway ?Mallampati: III ? ?TM Distance: >3 FB ?Neck ROM: full ? ? ? Dental ? ?(+) Teeth Intact ?  ?Pulmonary ?neg pulmonary ROS, asthma , former smoker,  ?  ?Pulmonary exam normal ?breath sounds clear to auscultation ? ? ? ? ? ? Cardiovascular ?Exercise Tolerance: Good ?hypertension, Pt. on medications ?negative cardio ROS ?Normal cardiovascular exam ?Rhythm:Regular Rate:Normal ? ? ?  ?Neuro/Psych ?Depression negative neurological ROS ? negative psych ROS  ? GI/Hepatic ?negative GI ROS, Neg liver ROS, GERD  ,  ?Endo/Other  ?negative endocrine ROS ? Renal/GU ?negative Renal ROS  ?negative genitourinary ?  ?Musculoskeletal ? ?(+) Arthritis ,  ? Abdominal ?(+)  ? ?Bowel sounds: normal.  ?Peds ?negative pediatric ROS ?(+)  Hematology ?negative hematology ROS ?(+)   ?Anesthesia Other Findings ?Past Medical History: ?No date: Arthritis ?No date: Asthma ?No date: Complication of anesthesia ?No date: Hypertension ? ?Past Surgical History: ?No date: CESAREAN SECTION ?No date: CHOLECYSTECTOMY ?06/15/2015: COLONOSCOPY WITH PROPOFOL; N/A ?    Comment:  Procedure: COLONOSCOPY WITH PROPOFOL;  Surgeon: Herbie Baltimore T ?             Vira Agar, MD;  Location: [place];  Service:  ?             Endoscopy;  Laterality: N/A; ?12/30/2019: COLONOSCOPY WITH PROPOFOL; N/A ?    Comment:  Procedure: COLONOSCOPY WITH PROPOFOL;  Surgeon: Allen Norris,  ?             Darren, MD;  Location: [place];  Service:  ?             Endoscopy;  Laterality: N/A; ?No date: urethrel dialation ?    Comment:  age 76 ? ?BMI   ? Body Mass Index: 28.56 kg/m?  ?  ? ? Reproductive/Obstetrics ?negative OB ROS ? ?  ? ? ? ? ? ? ? ? ? ? ? ? ? ?  ?  ? ? ? ? ? ? ? ? ?Anesthesia Physical ?Anesthesia Plan ? ?ASA: 2 ? ?Anesthesia Plan: General   ? ?Post-op Pain Management:   ? ?Induction: Intravenous ? ?PONV Risk Score and Plan: Propofol infusion and TIVA ? ?Airway Management Planned: Natural Airway and Nasal Cannula ? ?Additional Equipment:  ? ?Intra-op Plan:  ? ?Post-operative Plan:  ? ?Informed Consent: I have reviewed the patients History and Physical, chart, labs and discussed the procedure including the risks, benefits and alternatives for the proposed anesthesia with the patient or authorized representative who has indicated his/her understanding and acceptance.  ? ? ? ?Dental Advisory Given ? ?Plan Discussed with: Anesthesiologist, CRNA and Surgeon ? ?Anesthesia Plan Comments:   ? ? ? ? ? ? ?Anesthesia Quick Evaluation ? ?

## 2022-02-11 NOTE — Op Note (Signed)
[facility] ?Gastroenterology ?Patient Name: Sarah Harrison ?Procedure Date: 02/11/2022 8:39 AM ?MRN: [id number] ?Account #: [id number] ?Date of Birth: Nov 01, 1945 ?Admit Type: Outpatient ?Age: 76 ?Room: [facility] ENDO ROOM 4 ?Gender: Female ?Note Status: Finalized ?Instrument Name: Upper Endoscope [id number] ?Procedure:             Upper GI endoscopy ?Indications:           Epigastric abdominal pain ?Providers:             Annamaria Helling DO, DO ?Referring MD:          Leonie Douglas. Doy Hutching, MD (Referring MD) ?Medicines:             Monitored Anesthesia Care ?Complications:         No immediate complications. Estimated blood loss:  ?                       Minimal. ?Procedure:             Pre-Anesthesia Assessment: ?                       - Prior to the procedure, a History and Physical was  ?                       performed, and patient medications and allergies were  ?                       reviewed. The patient is competent. The risks and  ?                       benefits of the procedure and the sedation options and  ?                       risks were discussed with the patient. All questions  ?                       were answered and informed consent was obtained.  ?                       Patient identification and proposed procedure were  ?                       verified by the physician, the nurse, the anesthetist  ?                       and the technician in the endoscopy suite. Mental  ?                       Status Examination: alert and oriented. Airway  ?                       Examination: normal oropharyngeal airway and neck  ?                       mobility. Respiratory Examination: clear to  ?                       auscultation. CV Examination: RRR, no murmurs, no S3  ?  or S4. Prophylactic Antibiotics: The patient does not  ?                       require prophylactic antibiotics. Prior  ?                       Anticoagulants: The patient has taken no previous  ?                        anticoagulant or antiplatelet agents. ASA Grade  ?                       Assessment: II - A patient with mild systemic disease.  ?                       After reviewing the risks and benefits, the patient  ?                       was deemed in satisfactory condition to undergo the  ?                       procedure. The anesthesia plan was to use monitored  ?                       anesthesia care (MAC). Immediately prior to  ?                       administration of medications, the patient was  ?                       re-assessed for adequacy to receive sedatives. The  ?                       heart rate, respiratory rate, oxygen saturations,  ?                       blood pressure, adequacy of pulmonary ventilation, and  ?                       response to care were monitored throughout the  ?                       procedure. The physical status of the patient was  ?                       re-assessed after the procedure. ?                       After obtaining informed consent, the endoscope was  ?                       passed under direct vision. Throughout the procedure,  ?                       the patient's blood pressure, pulse, and oxygen  ?                       saturations were monitored continuously. The Endoscope  ?  was introduced through the mouth, and advanced to the  ?                       second part of duodenum. The upper GI endoscopy was  ?                       accomplished without difficulty. The patient tolerated  ?                       the procedure well. ?Findings: ?     The duodenal bulb, first portion of the duodenum and second portion of  ?     the duodenum were normal. Estimated blood loss: none. ?     Localized mild inflammation characterized by erythema was found in the  ?     gastric antrum. Biopsies were taken with a cold forceps for Helicobacter  ?     pylori testing. Estimated blood loss was minimal. ?     Multiple diminutive sessile polyps with  no bleeding and no stigmata of  ?     recent bleeding were found in the gastric fundus and in the gastric body. ?     A small hiatal hernia was present. Estimated blood loss: none. ?     The Z-line was regular. Estimated blood loss: none. ?     Esophagogastric landmarks were identified: the gastroesophageal junction  ?     was found at 35 cm from the incisors. ?     The exam of the esophagus was otherwise normal. ?Impression:            - Normal duodenal bulb, first portion of the duodenum  ?                       and second portion of the duodenum. ?                       - Gastritis. Biopsied. ?                       - Multiple gastric polyps. ?                       - Small hiatal hernia. ?                       - Z-line regular. ?                       - Esophagogastric landmarks identified. ?Recommendation:        - Discharge patient to home. ?                       - Resume previous diet. ?                       - Continue present medications. ?                       - Continue pantoprazole/protonix prescribed by Ms.  ?                       Jerelene Redden. ?                       -  Await pathology results. ?                       - Return to referring physician as previously  ?                       scheduled. ?                       - The findings and recommendations were discussed with  ?                       the patient. ?Procedure Code(s):     --- Professional --- ?                       [phone], Esophagogastroduodenoscopy, flexible,  ?                       transoral; with biopsy, single or multiple ?Diagnosis Code(s):     --- Professional --- ?                       K29.70, Gastritis, unspecified, without bleeding ?                       K31.7, Polyp of stomach and duodenum ?                       K44.9, Diaphragmatic hernia without obstruction or  ?                       gangrene ?                       R10.13, Epigastric pain ?CPT copyright 2019 [employer] Medical Association. All rights reserved. ?The codes  documented in this report are preliminary and upon coder review may  ?be revised to meet current compliance requirements. ?Attending Participation: ?     I personally performed the entire procedure. ?Volney American, DO ?Annamaria Helling DO, DO ?02/11/2022 8:55:51 AM ?This report has been signed electronically. ?Number of Addenda: 0 ?Note Initiated On: 02/11/2022 8:39 AM ?Estimated Blood Loss:  Estimated blood loss was minimal. ?     [facility] ?

## 2022-02-11 NOTE — Transfer of Care (Signed)
Immediate Anesthesia Transfer of Care Note ? ?Patient: Sarah Harrison ? ?Procedure(s) Performed: ESOPHAGOGASTRODUODENOSCOPY (EGD) ? ?Patient Location: Endoscopy Unit ? ?Anesthesia Type:General ? ?Level of Consciousness: awake and alert  ? ?Airway & Oxygen Therapy: Patient Spontanous Breathing ? ?Post-op Assessment: Report given to RN and Post -op Vital signs reviewed and stable ? ?Post vital signs: Reviewed and stable ? ?Last Vitals:  ?Vitals Value Taken Time  ?BP 109/48 02/11/22 0855  ?Temp    ?Pulse 70 02/11/22 0855  ?Resp 19 02/11/22 0855  ?SpO2 94 % 02/11/22 0855  ?Vitals shown include unvalidated device data. ? ?Last Pain:  ?Vitals:  ? 02/11/22 0734  ?TempSrc: Temporal  ?PainSc: 0-No pain  ?   ? ?  ? ?Complications: No notable events documented. ?

## 2022-02-11 NOTE — Anesthesia Procedure Notes (Signed)
Procedure Name: [place] ?Date/Time: 02/11/2022 8:26 AM ?Performed by: Tollie Eth, CRNA ?Pre-anesthesia Checklist: Patient identified, Emergency Drugs available, Suction available and Patient being monitored ?Patient Re-evaluated:Patient Re-evaluated prior to induction ?Oxygen Delivery Method: Nasal cannula ?Induction Type: IV induction ?Placement Confirmation: positive ETCO2 ? ? ? ? ?

## 2022-02-11 NOTE — Interval H&P Note (Signed)
History and Physical Interval Note: Preprocedure H&P from 02/11/22 ? was reviewed and there was no interval change after seeing and examining the patient.  Written consent was obtained from the patient after discussion of risks, benefits, and alternatives. Patient has consented to proceed with Esophagogastroduodenoscopy with possible intervention ? ? ?02/11/2022 ?8:41 AM ? ?Sarah Harrison  has presented today for surgery, with the diagnosis of Epigastric pain (R10.13) ?Gastroesophageal reflux disease, unspecified whether esophagitis present (K21.9).  The various methods of treatment have been discussed with the patient and family. After consideration of risks, benefits and other options for treatment, the patient has consented to  Procedure(s): ?ESOPHAGOGASTRODUODENOSCOPY (EGD) (N/A) as a surgical intervention.  The patient's history has been reviewed, patient examined, no change in status, stable for surgery.  I have reviewed the patient's chart and labs.  Questions were answered to the patient's satisfaction.   ? ? ?Annamaria Helling ? ? ?

## 2022-02-14 ENCOUNTER — Encounter: Payer: Self-pay | Admitting: Gastroenterology

## 2022-02-14 LAB — SURGICAL PATHOLOGY

## 2022-08-05 ENCOUNTER — Other Ambulatory Visit: Payer: Self-pay | Admitting: Physician Assistant

## 2022-08-05 DIAGNOSIS — R6889 Other general symptoms and signs: Secondary | ICD-10-CM

## 2022-08-05 DIAGNOSIS — I10 Essential (primary) hypertension: Secondary | ICD-10-CM

## 2022-08-05 DIAGNOSIS — E782 Mixed hyperlipidemia: Secondary | ICD-10-CM

## 2022-08-05 DIAGNOSIS — Z8249 Family history of ischemic heart disease and other diseases of the circulatory system: Secondary | ICD-10-CM

## 2022-08-05 DIAGNOSIS — G4733 Obstructive sleep apnea (adult) (pediatric): Secondary | ICD-10-CM

## 2022-08-05 DIAGNOSIS — R002 Palpitations: Secondary | ICD-10-CM

## 2022-08-10 ENCOUNTER — Telehealth (HOSPITAL_COMMUNITY): Payer: Self-pay | Admitting: *Deleted

## 2022-08-10 MED ORDER — METOPROLOL TARTRATE 100 MG PO TABS: ORAL_TABLET | ORAL | 0 refills | Status: DC

## 2022-08-10 NOTE — Telephone Encounter (Signed)
Reaching out to patient to offer assistance regarding upcoming cardiac imaging study; pt verbalizes understanding of appt date/time, parking situation and where to check in, medications ordered, and verified current allergies; name and call back number provided for further questions should they arise  Gordy Clement RN Navigator Cardiac Imaging [facility] [phone] office [phone] cell  Patient to hold her daily BP medications and take '100mg'$  metoprolol tartrate two hours prior to her cardiac CT scan.

## 2022-08-11 ENCOUNTER — Ambulatory Visit
Admission: RE | Admit: 2022-08-11 | Discharge: 2022-08-11 | Disposition: A | Discharge status: Home / Self Care | Payer: Medicare PPO | Source: Ambulatory Visit | Attending: Physician Assistant | Admitting: Physician Assistant

## 2022-08-11 DIAGNOSIS — R002 Palpitations: Secondary | ICD-10-CM | POA: Insufficient documentation

## 2022-08-11 DIAGNOSIS — I2510 Atherosclerotic heart disease of native coronary artery without angina pectoris: Secondary | ICD-10-CM | POA: Diagnosis not present

## 2022-08-11 DIAGNOSIS — I700 Atherosclerosis of aorta: Secondary | ICD-10-CM | POA: Diagnosis not present

## 2022-08-11 DIAGNOSIS — K760 Fatty (change of) liver, not elsewhere classified: Secondary | ICD-10-CM | POA: Diagnosis not present

## 2022-08-11 DIAGNOSIS — I10 Essential (primary) hypertension: Secondary | ICD-10-CM | POA: Insufficient documentation

## 2022-08-11 DIAGNOSIS — G4733 Obstructive sleep apnea (adult) (pediatric): Secondary | ICD-10-CM | POA: Insufficient documentation

## 2022-08-11 DIAGNOSIS — Z8249 Family history of ischemic heart disease and other diseases of the circulatory system: Secondary | ICD-10-CM | POA: Insufficient documentation

## 2022-08-11 DIAGNOSIS — R6889 Other general symptoms and signs: Secondary | ICD-10-CM | POA: Insufficient documentation

## 2022-08-11 DIAGNOSIS — E782 Mixed hyperlipidemia: Secondary | ICD-10-CM | POA: Insufficient documentation

## 2022-08-11 MED ORDER — NITROGLYCERIN 0.4 MG SL SUBL
0.8000 mg | SUBLINGUAL_TABLET | Freq: Once | SUBLINGUAL | Status: AC
  Administered 2022-08-11: 0.8 mg via SUBLINGUAL

## 2022-08-11 MED ORDER — IOHEXOL 350 MG/ML SOLN
75.0000 mL | Freq: Once | INTRAVENOUS | Status: AC | PRN
  Administered 2022-08-11: 75 mL via INTRAVENOUS

## 2022-08-11 NOTE — Progress Notes (Signed)
Patient tolerated procedure well. Ambulate w/o difficulty. Denies any lightheadedness or being dizzy. Pt denies any pain at this time.  Patient is encouraged to drink additional water throughout the day and reason explained to patient. Patient verbalized understanding and all questions answered. ABC intact. No further needs at this time. Discharge from procedure area w/o issues.

## 2023-03-23 ENCOUNTER — Other Ambulatory Visit: Payer: Self-pay

## 2023-03-23 ENCOUNTER — Ambulatory Visit
Admission: RE | Admit: 2023-03-23 | Discharge: 2023-03-23 | Disposition: A | Discharge status: Home / Self Care | Payer: Medicare PPO | Source: Ambulatory Visit | Attending: Physician Assistant | Admitting: Physician Assistant

## 2023-03-23 DIAGNOSIS — M7989 Other specified soft tissue disorders: Secondary | ICD-10-CM | POA: Insufficient documentation

## 2023-03-23 DIAGNOSIS — M7122 Synovial cyst of popliteal space [Baker], left knee: Secondary | ICD-10-CM

## 2023-03-23 HISTORY — DX: Synovial cyst of popliteal space (Baker), left knee: M71.22

## 2023-05-03 ENCOUNTER — Other Ambulatory Visit: Payer: Self-pay | Admitting: Orthopedic Surgery

## 2023-05-03 DIAGNOSIS — M1712 Unilateral primary osteoarthritis, left knee: Secondary | ICD-10-CM

## 2023-05-15 ENCOUNTER — Encounter: Payer: Self-pay | Admitting: Orthopedic Surgery

## 2023-05-17 ENCOUNTER — Ambulatory Visit
Admission: RE | Admit: 2023-05-17 | Discharge: 2023-05-17 | Disposition: A | Discharge status: Home / Self Care | Payer: Medicare PPO | Source: Ambulatory Visit | Attending: Orthopedic Surgery | Admitting: Orthopedic Surgery

## 2023-05-17 DIAGNOSIS — M1712 Unilateral primary osteoarthritis, left knee: Secondary | ICD-10-CM

## 2023-05-22 IMAGING — CT CT ABD-PELV W/ CM
2 of 5 series · 16 of 46 positions shown, 18 images · IV contrast (APPLIED)
Comparison: 2885

CLINICAL DATA: Left lower quadrant pain

EXAM:
CT ABDOMEN AND PELVIS WITH CONTRAST
TECHNIQUE: Multidetector CT imaging of the abdomen and pelvis was performed
using the standard protocol following bolus administration of
intravenous contrast.
CONTRAST:  100mL OMNIPAQUE IOHEXOL 350 MG/ML SOLN

[Series 4: coronal st · coronal · 0.76mm/px · 3 of 101 slices shown]
[im 34/101  soft-tissue]
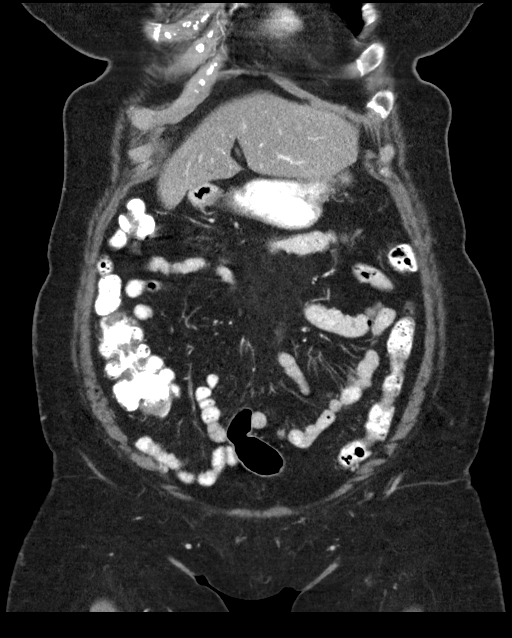
[im 45/101  soft-tissue]
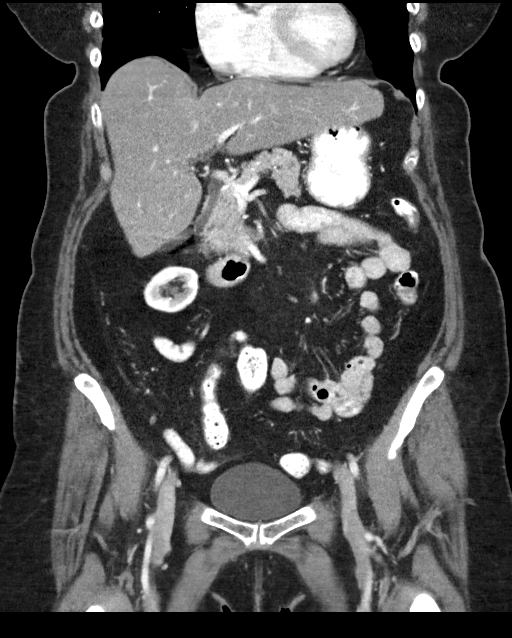
[im 56/101  soft-tissue]
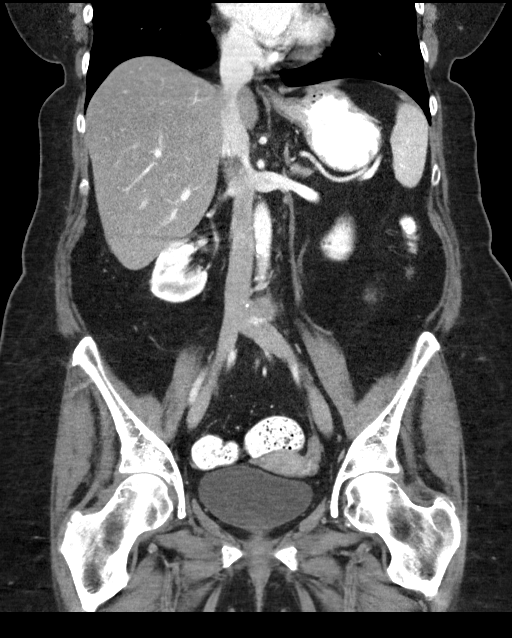

[Series 6: axial st · axial · 0.86mm/px · z∈[-501,-96]mm · 13 of 93 slices shown, 15 images]
[im 6/93  soft-tissue]
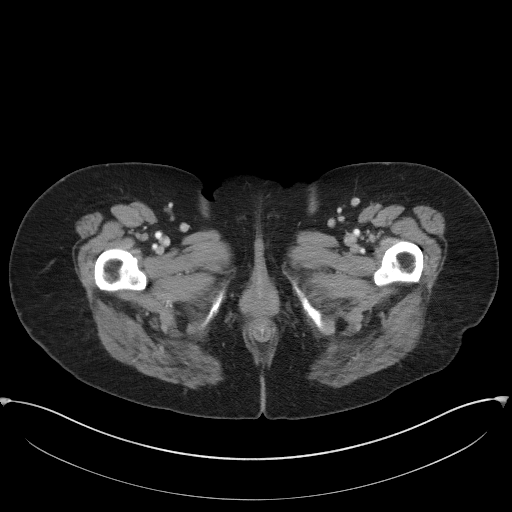
[im 6/93  bone]
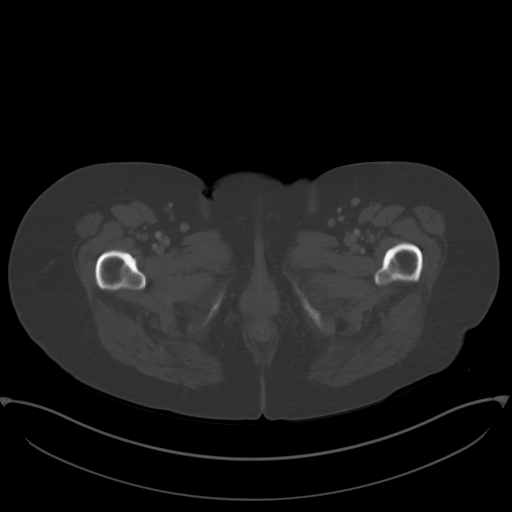
[im 11/93  soft-tissue]
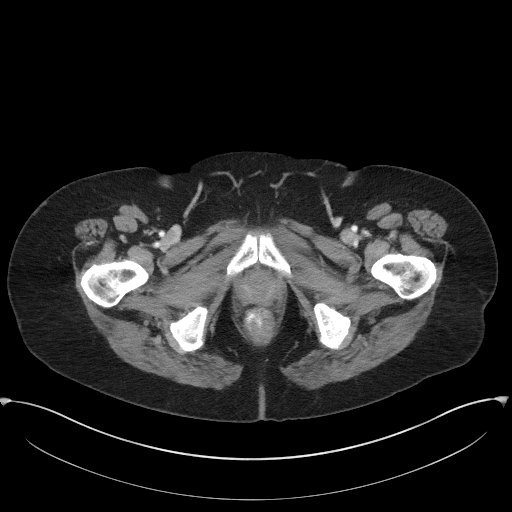
[im 22/93  soft-tissue]
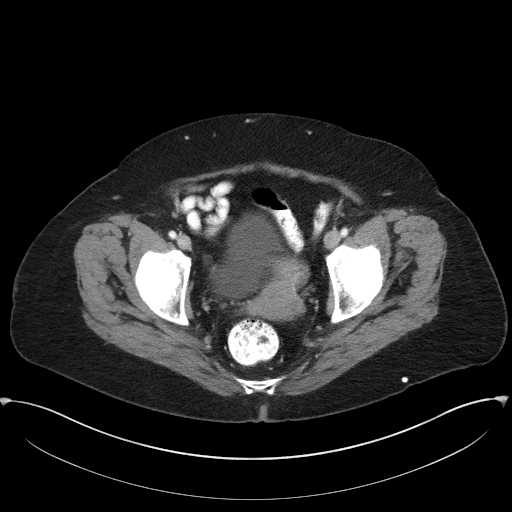
[im 28/93  soft-tissue]
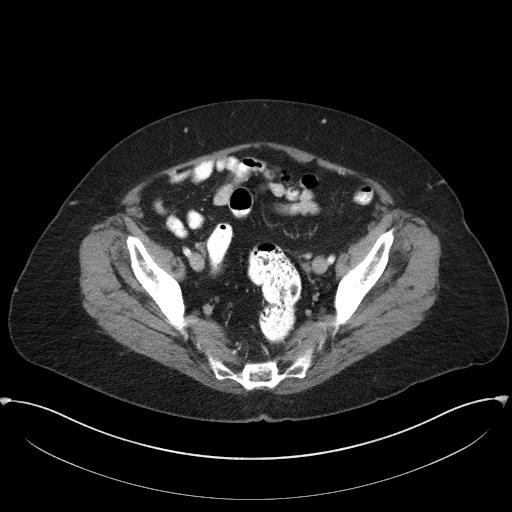
[im 33/93  soft-tissue]
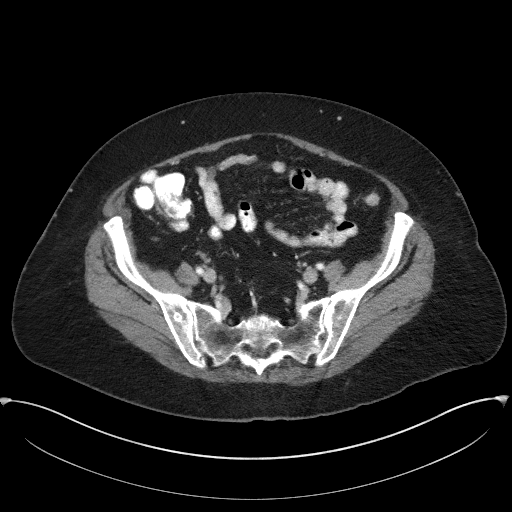
[im 38/93  soft-tissue]
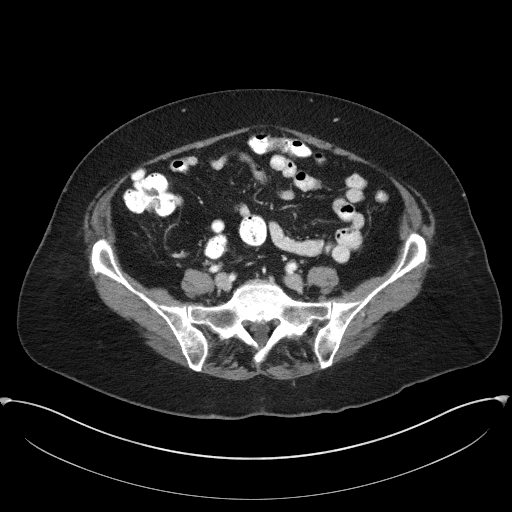
[im 49/93  soft-tissue]
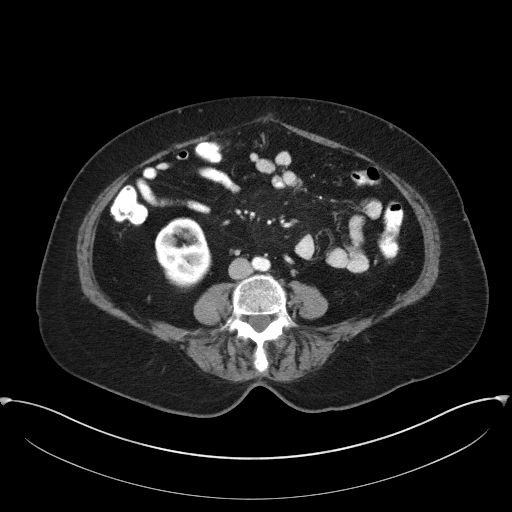
[im 55/93  soft-tissue]
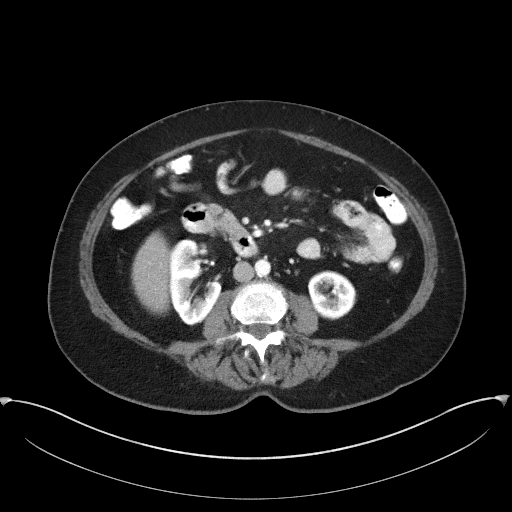
[im 60/93  soft-tissue]
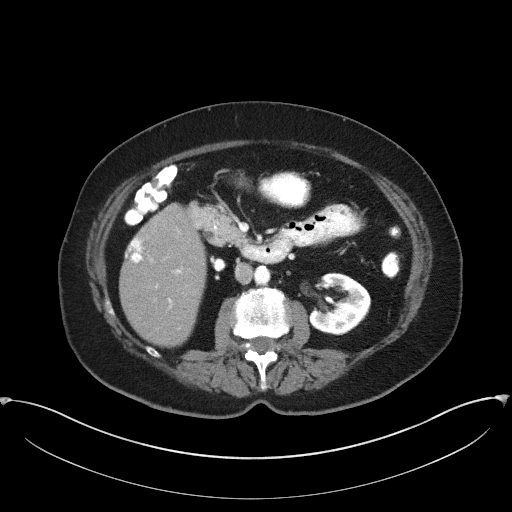
[im 60/93  bone]
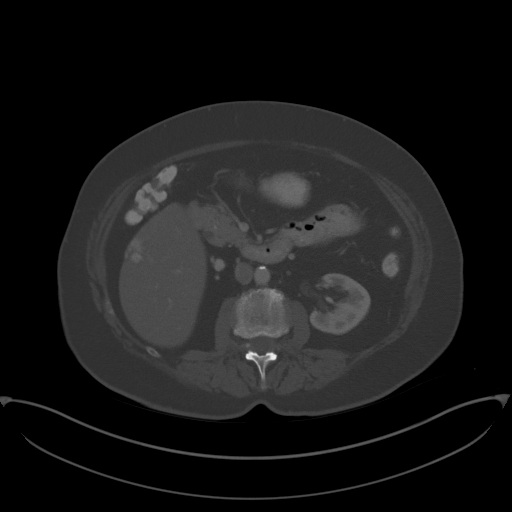
[im 65/93  soft-tissue]
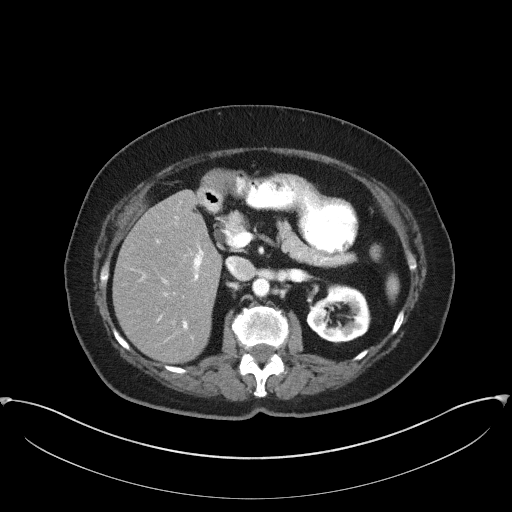
[im 71/93  soft-tissue]
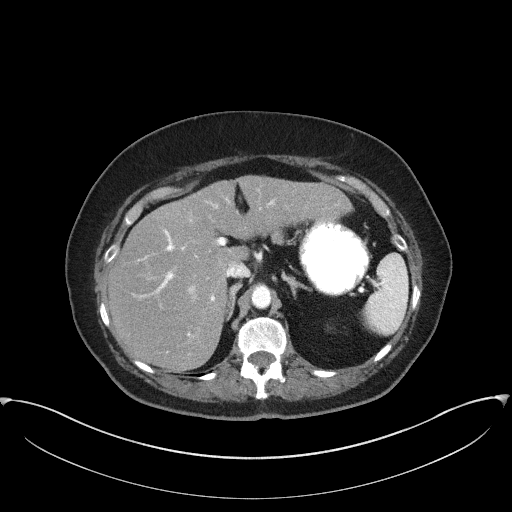
[im 82/93  soft-tissue]
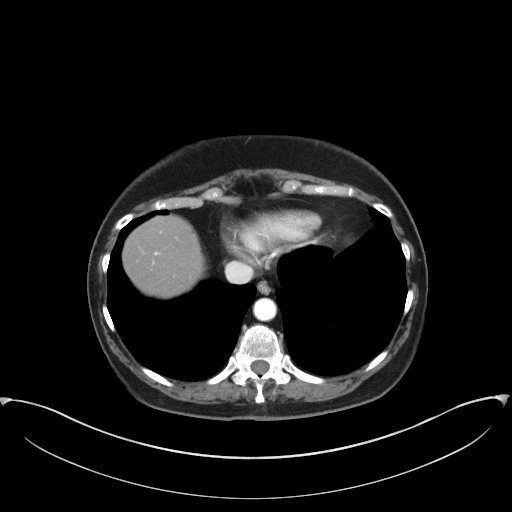
[im 87/93  soft-tissue]
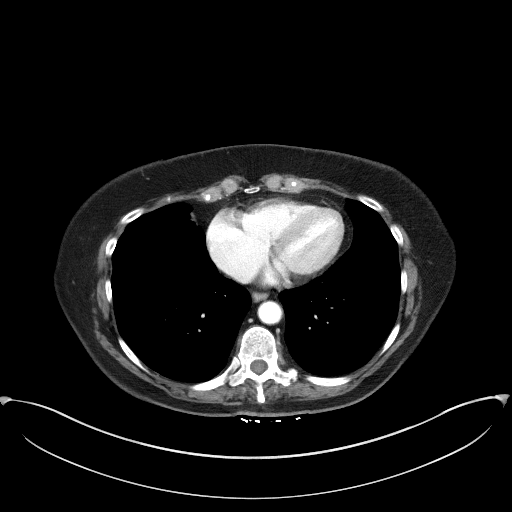

[16 of 46 positions shown; findings below may reference images not displayed]

FINDINGS: Lower chest: Lungs bases are clear.

Hepatobiliary: Low-attenuation lesions are identified in the right
hepatic lobe. The more inferior lesion demonstrates enhancement
pattern consistent with a hemangioma. More superior lesion was also
characterized as a hemangioma on prior MRI abdomen. Post
cholecystectomy. No biliary dilatation.

Pancreas: Unremarkable.

Spleen: Unremarkable.

Adrenals/Urinary Tract: Adrenals are unremarkable. Malrotation of
the right kidney. Bladder is unremarkable.

Stomach/Bowel: Stomach is within normal limits. Bowel is normal in
caliber. Normal appendix.

Vascular/Lymphatic: Aortic atherosclerosis. No enlarged lymph nodes.

Reproductive: Uterus and bilateral adnexa are unremarkable.

Other: No free fluid.  Abdominal wall is unremarkable.

Musculoskeletal: Degenerative changes of the included spine.
IMPRESSION: No acute abnormality or findings to account for reported symptoms.

Liver hemangiomas are again noted.

Aortic atherosclerosis.

## 2023-06-21 ENCOUNTER — Encounter: Payer: Self-pay | Admitting: Gastroenterology

## 2023-06-22 ENCOUNTER — Ambulatory Visit: Payer: Medicare PPO | Admitting: Registered Nurse

## 2023-06-22 ENCOUNTER — Encounter: Payer: Self-pay | Admitting: Gastroenterology

## 2023-06-22 ENCOUNTER — Ambulatory Visit
Admission: RE | Admit: 2023-06-22 | Discharge: 2023-06-22 | Disposition: A | Discharge status: Home / Self Care | Payer: Medicare PPO | Attending: Gastroenterology | Admitting: Gastroenterology

## 2023-06-22 ENCOUNTER — Other Ambulatory Visit: Payer: Self-pay

## 2023-06-22 ENCOUNTER — Encounter
Admission: RE | Disposition: A | Discharge status: Home / Self Care | Payer: Self-pay | Source: Home / Self Care | Attending: Gastroenterology

## 2023-06-22 DIAGNOSIS — J4489 Other specified chronic obstructive pulmonary disease: Secondary | ICD-10-CM | POA: Diagnosis not present

## 2023-06-22 DIAGNOSIS — K3189 Other diseases of stomach and duodenum: Secondary | ICD-10-CM | POA: Insufficient documentation

## 2023-06-22 DIAGNOSIS — K2970 Gastritis, unspecified, without bleeding: Secondary | ICD-10-CM | POA: Diagnosis not present

## 2023-06-22 DIAGNOSIS — K222 Esophageal obstruction: Secondary | ICD-10-CM | POA: Insufficient documentation

## 2023-06-22 DIAGNOSIS — K449 Diaphragmatic hernia without obstruction or gangrene: Secondary | ICD-10-CM | POA: Diagnosis not present

## 2023-06-22 DIAGNOSIS — I10 Essential (primary) hypertension: Secondary | ICD-10-CM | POA: Insufficient documentation

## 2023-06-22 DIAGNOSIS — K317 Polyp of stomach and duodenum: Secondary | ICD-10-CM | POA: Diagnosis not present

## 2023-06-22 DIAGNOSIS — Z83719 Family history of colon polyps, unspecified: Secondary | ICD-10-CM | POA: Insufficient documentation

## 2023-06-22 DIAGNOSIS — E785 Hyperlipidemia, unspecified: Secondary | ICD-10-CM | POA: Diagnosis not present

## 2023-06-22 DIAGNOSIS — G4730 Sleep apnea, unspecified: Secondary | ICD-10-CM | POA: Diagnosis not present

## 2023-06-22 DIAGNOSIS — Z09 Encounter for follow-up examination after completed treatment for conditions other than malignant neoplasm: Secondary | ICD-10-CM | POA: Insufficient documentation

## 2023-06-22 HISTORY — DX: Depression, unspecified: F32.A

## 2023-06-22 HISTORY — PX: ESOPHAGOGASTRODUODENOSCOPY (EGD) WITH PROPOFOL: SHX5813

## 2023-06-22 HISTORY — PX: BIOPSY: SHX5522

## 2023-06-22 HISTORY — DX: Cystitis, unspecified without hematuria: N30.90

## 2023-06-22 HISTORY — DX: Sleep apnea, unspecified: G47.30

## 2023-06-22 HISTORY — DX: Anxiety disorder, unspecified: F41.9

## 2023-06-22 HISTORY — DX: Unspecified chronic gastritis without bleeding: K29.50

## 2023-06-22 HISTORY — DX: Hyperlipidemia, unspecified: E78.5

## 2023-06-22 HISTORY — DX: Chronic obstructive pulmonary disease, unspecified: J44.9

## 2023-06-22 SURGERY — ESOPHAGOGASTRODUODENOSCOPY (EGD) WITH PROPOFOL
Anesthesia: General

## 2023-06-22 MED ORDER — PROPOFOL 500 MG/50ML IV EMUL
INTRAVENOUS | Status: DC | PRN
  Administered 2023-06-22: 125 ug/kg/min via INTRAVENOUS

## 2023-06-22 MED ORDER — GLYCOPYRROLATE 0.2 MG/ML IJ SOLN
INTRAMUSCULAR | Status: DC | PRN
  Administered 2023-06-22: .2 mg via INTRAVENOUS

## 2023-06-22 MED ORDER — LIDOCAINE HCL (CARDIAC) PF 100 MG/5ML IV SOSY
PREFILLED_SYRINGE | INTRAVENOUS | Status: DC | PRN
  Administered 2023-06-22: 40 mg via INTRAVENOUS

## 2023-06-22 MED ORDER — SODIUM CHLORIDE 0.9 % IV SOLN: INTRAVENOUS | Status: DC

## 2023-06-22 MED ORDER — PROPOFOL 10 MG/ML IV BOLUS
INTRAVENOUS | Status: DC | PRN
  Administered 2023-06-22: 50 mg via INTRAVENOUS

## 2023-06-22 NOTE — H&P (Signed)
Pre-Procedure H&P   Patient ID: Sarah Harrison is a 77 y.o. female.  Gastroenterology Provider: Jaynie Collins, DO  Referring Provider: Fransico Setters, NP PCP: Marguarite Arbour, MD  Date: 06/22/2023  HPI [name] is a 77 y.o. female who presents today for Esophagogastroduodenoscopy for Gastric intestinal metaplasia surveillance .  Patient underwent EGD in April 2023 demonstrating gastritis, polyps and hiatal hernia.  Biopsies were negative for H. pylori in the antrum, however, focal intestinal metaplasia was appreciated No reflux dysphagia or odynophagia  Creatinine 0.7 hemoglobin 13.4 MCV 95 platelets 136,000   Past Medical History:  Diagnosis Date   Anxiety    Arthritis    Asthma    Chronic gastritis    Complication of anesthesia    COPD (chronic obstructive pulmonary disease) (HCC)    Cystitis    Depression    HLD (hyperlipidemia)    Hypertension    Sleep apnea     Past Surgical History:  Procedure Laterality Date   CESAREAN SECTION     CHOLECYSTECTOMY     COLONOSCOPY WITH PROPOFOL N/A 06/15/2015   Procedure: COLONOSCOPY WITH PROPOFOL;  Surgeon: Scot Jun, MD;  Location: [facility] ENDOSCOPY;  Service: Endoscopy;  Laterality: N/A;   COLONOSCOPY WITH PROPOFOL N/A 12/30/2019   Procedure: COLONOSCOPY WITH PROPOFOL;  Surgeon: Midge Minium, MD;  Location: [facility] ENDOSCOPY;  Service: Endoscopy;  Laterality: N/A;   DILATION AND CURETTAGE OF UTERUS     ESOPHAGOGASTRODUODENOSCOPY N/A 02/11/2022   Procedure: ESOPHAGOGASTRODUODENOSCOPY (EGD);  Surgeon: Jaynie Collins, DO;  Location: [facility] ENDOSCOPY;  Service: Gastroenterology;  Laterality: N/A;   urethrel dialation     age 53    Family History Fhx of colon polyps No h/o GI disease or malignancy  Review of Systems  Constitutional:  Negative for activity change, appetite change, chills, diaphoresis, fatigue, fever and unexpected weight change.  HENT:  Negative for trouble swallowing and voice  change.   Respiratory:  Negative for shortness of breath and wheezing.   Cardiovascular:  Negative for chest pain, palpitations and leg swelling.  Gastrointestinal:  Negative for abdominal distention, abdominal pain, anal bleeding, blood in stool, constipation, diarrhea, nausea, rectal pain and vomiting.  Musculoskeletal:  Negative for arthralgias and myalgias.  Skin:  Negative for color change and pallor.  Neurological:  Negative for dizziness, syncope and weakness.  Psychiatric/Behavioral:  Negative for confusion.   All other systems reviewed and are negative.    Medications No current facility-administered medications on file prior to encounter.   Current Outpatient Medications on File Prior to Encounter  Medication Sig Dispense Refill   bisoprolol-hydrochlorothiazide (ZIAC) 5-6.25 MG tablet Take 1 tablet by mouth daily.     pantoprazole (PROTONIX) 40 MG tablet Take 40 mg by mouth daily.     rosuvastatin (CRESTOR) 5 MG tablet Take 5 mg by mouth daily.     albuterol (PROVENTIL HFA;VENTOLIN HFA) 108 (90 BASE) MCG/ACT inhaler Inhale 2 puffs into the lungs every 6 (six) hours as needed for wheezing or shortness of breath.     calcium carbonate (TUMS EX) 750 MG chewable tablet Chew 1 tablet by mouth daily.     Cholecalciferol 2000 UNITS CAPS Take 2,000 Units by mouth daily.     Cyanocobalamin (VITAMIN B 12 PO) Take 1 tablet by mouth daily.     EPINEPHrine 0.3 mg/0.3 mL IJ SOAJ injection Inject 0.3 mg into the muscle once. USE AS DIRECTED     Meth-Hyo-M Bl-Na Phos-Ph Sal (URIBEL) 118 MG CAPS  Take 1 capsule (118 mg total) by mouth 4 (four) times daily as needed. 60 capsule 11   metoprolol tartrate (LOPRESSOR) 100 MG tablet Take tablet (100mg ) TWO hours prior to your cardiac CT scan. Do not take your daily BP medication. 1 tablet 0   traZODone (DESYREL) 50 MG tablet Take 1 tablet by mouth at bedtime.      Pertinent medications related to GI and procedure were reviewed by me with the patient  prior to the procedure   Current Facility-Administered Medications:    0.9 %  sodium chloride infusion, , Intravenous, Continuous, Jaynie Collins, DO, Last Rate: 20 mL/hr at 06/22/23 0847, New Bag at 06/22/23 0847  sodium chloride 20 mL/hr at 06/22/23 4098       Allergies  Allergen Reactions   Penicillins Anaphylaxis   Bee Venom Swelling   Elemental Sulfur    Sudafed [Pseudoephedrine Hcl]    Allergies were reviewed by me prior to the procedure  Objective   Body mass index is 28.02 kg/m. Vitals:   06/22/23 0838  BP: (!) 153/56  Pulse: (!) 51  Resp: 15  Temp: (!) 96.5 F (35.8 C)  TempSrc: Temporal  SpO2: 100%  Weight: 76.4 kg  Height: 5\' 5"  (1.651 m)     Physical Exam Vitals and nursing note reviewed.  Constitutional:      General: She is not in acute distress.    Appearance: Normal appearance. She is not ill-appearing, toxic-appearing or diaphoretic.  HENT:     Head: Normocephalic and atraumatic.     Nose: Nose normal.     Mouth/Throat:     Mouth: Mucous membranes are moist.     Pharynx: Oropharynx is clear.  Eyes:     General: No scleral icterus.    Extraocular Movements: Extraocular movements intact.  Cardiovascular:     Rate and Rhythm: Regular rhythm. Bradycardia present.     Heart sounds: Normal heart sounds. No murmur heard.    No friction rub. No gallop.  Pulmonary:     Effort: Pulmonary effort is normal. No respiratory distress.     Breath sounds: Normal breath sounds. No wheezing, rhonchi or rales.  Abdominal:     General: Bowel sounds are normal. There is no distension.     Palpations: Abdomen is soft.     Tenderness: There is no abdominal tenderness. There is no guarding or rebound.  Musculoskeletal:     Cervical back: Neck supple.     Right lower leg: No edema.     Left lower leg: No edema.  Skin:    General: Skin is warm and dry.     Coloration: Skin is not jaundiced or pale.  Neurological:     General: No focal deficit present.      Mental Status: She is alert and oriented to person, place, and time. Mental status is at baseline.  Psychiatric:        Mood and Affect: Mood normal.        Behavior: Behavior normal.        Thought Content: Thought content normal.        Judgment: Judgment normal.      Assessment:  [name] is a 77 y.o. female  who presents today for Esophagogastroduodenoscopy for Gastric intestinal metaplasia surveillance .  Plan:  Esophagogastroduodenoscopy with possible intervention today  Esophagogastroduodenoscopy with possible biopsy, control of bleeding, polypectomy, and interventions as necessary has been discussed with the patient/patient representative. Informed consent was obtained from  the patient/patient representative after explaining the indication, nature, and risks of the procedure including but not limited to death, bleeding, perforation, missed neoplasm/lesions, cardiorespiratory compromise, and reaction to medications. Opportunity for questions was given and appropriate answers were provided. Patient/patient representative has verbalized understanding is amenable to undergoing the procedure.   Jaynie Collins, DO  [facility]  Portions of the record may have been created with voice recognition software. Occasional wrong-word or 'sound-a-like' substitutions may have occurred due to the inherent limitations of voice recognition software.  Read the chart carefully and recognize, using context, where substitutions may have occurred.

## 2023-06-22 NOTE — Interval H&P Note (Signed)
History and Physical Interval Note: Preprocedure H&P from 06/22/23  was reviewed and there was no interval change after seeing and examining the patient.  Written consent was obtained from the patient after discussion of risks, benefits, and alternatives. Patient has consented to proceed with Esophagogastroduodenoscopy with possible intervention   06/22/2023 9:14 AM  Sarah Harrison  has presented today for surgery, with the diagnosis of 530.81 (ICD-9-CM) - K21.9 (ICD-10-CM) - Gastroesophageal reflux disease, unspecified whether esophagitis present 537.89 (ICD-9-CM) - K31.A0 (ICD-10-CM) - Intestinal metaplasia of gastric mucosa.  The various methods of treatment have been discussed with the patient and family. After consideration of risks, benefits and other options for treatment, the patient has consented to  Procedure(s): ESOPHAGOGASTRODUODENOSCOPY (EGD) WITH PROPOFOL (N/A) as a surgical intervention.  The patient's history has been reviewed, patient examined, no change in status, stable for surgery.  I have reviewed the patient's chart and labs.  Questions were answered to the patient's satisfaction.     Jaynie Collins

## 2023-06-22 NOTE — Op Note (Signed)
[facility] Patient Name: Sarah Harrison Procedure Date: 06/22/2023 9:18 AM MRN: [id number] Account #: [id number] Date of Birth: Sep 17, 1946 Admit Type: Outpatient Age: 77 Room: [facility] ENDO ROOM 1 Gender: Female Note Status: Finalized Instrument Name: Upper Endoscope [id number] Procedure:             Upper GI endoscopy Indications:           Follow-up of intestinal metaplasia Providers:             Trenda Moots, DO Referring MD:          Duane Lope. Judithann Sheen, MD (Referring MD) Medicines:             Monitored Anesthesia Care Complications:         No immediate complications. Estimated blood loss:                         Minimal. Procedure:             Pre-Anesthesia Assessment:                        - Prior to the procedure, a History and Physical was                         performed, and patient medications and allergies were                         reviewed. The patient is competent. The risks and                         benefits of the procedure and the sedation options and                         risks were discussed with the patient. All questions                         were answered and informed consent was obtained.                         Patient identification and proposed procedure were                         verified by the physician, the nurse, the anesthetist                         and the technician in the endoscopy suite. Mental                         Status Examination: alert and oriented. Airway                         Examination: normal oropharyngeal airway and neck                         mobility. Respiratory Examination: clear to                         auscultation. CV Examination: RRR, no murmurs, no S3  or S4. Prophylactic Antibiotics: The patient does not                         require prophylactic antibiotics. Prior                         Anticoagulants: The patient has taken no  anticoagulant                         or antiplatelet agents. ASA Grade Assessment: II - A                         patient with mild systemic disease. After reviewing                         the risks and benefits, the patient was deemed in                         satisfactory condition to undergo the procedure. The                         anesthesia plan was to use monitored anesthesia care                         (MAC). Immediately prior to administration of                         medications, the patient was re-assessed for adequacy                         to receive sedatives. The heart rate, respiratory                         rate, oxygen saturations, blood pressure, adequacy of                         pulmonary ventilation, and response to care were                         monitored throughout the procedure. The physical                         status of the patient was re-assessed after the                         procedure.                        After obtaining informed consent, the endoscope was                         passed under direct vision. Throughout the procedure,                         the patient's blood pressure, pulse, and oxygen                         saturations were monitored continuously. The Endoscope  was introduced through the mouth, and advanced to the                         second part of duodenum. The upper GI endoscopy was                         accomplished without difficulty. The patient tolerated                         the procedure well. Findings:      The duodenal bulb, first portion of the duodenum and second portion of       the duodenum were normal. Estimated blood loss: none.      Multiple small sessile polyps with no bleeding and no stigmata of recent       bleeding were found in the gastric body. Consistent with fundic gland       polyps. All <75mm. Estimated blood loss: none.      Localized mild inflammation  characterized by erosions and erythema was       found in the gastric antrum. Biopsies were taken with a cold forceps for       histology. Gastric Intestinal Metaplasia mapping collected according to       [place] protocol. Placed in two separate jars (antru/incisura and body)       Estimated blood loss was minimal.      The exam of the stomach was otherwise normal.      Esophagogastric landmarks were identified: the gastroesophageal junction       was found at 35 cm from the incisors.      The Z-line was regular. Estimated blood loss: none.      A widely patent Schatzki ring was found at the gastroesophageal       junction. Estimated blood loss: none.      The exam of the esophagus was otherwise normal. Impression:            - Normal duodenal bulb, first portion of the duodenum                         and second portion of the duodenum.                        - Multiple gastric polyps.                        - Gastritis. Biopsied.                        - Esophagogastric landmarks identified.                        - Z-line regular.                        - Widely patent Schatzki ring. Recommendation:        - Patient has a contact number available for                         emergencies. The signs and symptoms of potential  delayed complications were discussed with the patient.                         Return to normal activities tomorrow. Written                         discharge instructions were provided to the patient.                        - Discharge patient to home.                        - Resume previous diet.                        - Continue present medications.                        - Await pathology results.                        - Repeat upper endoscopy for surveillance based on                         pathology results.                        - Return to referring physician as previously                         scheduled.                         - The findings and recommendations were discussed with                         the patient. Procedure Code(s):     --- Professional ---                        [phone], Esophagogastroduodenoscopy, flexible,                         transoral; with biopsy, single or multiple Diagnosis Code(s):     --- Professional ---                        K31.7, Polyp of stomach and duodenum                        K29.70, Gastritis, unspecified, without bleeding                        K22.2, Esophageal obstruction                        K31.A0, Gastric intestinal metaplasia, unspecified CPT copyright 2022 American Medical Association. All rights reserved. The codes documented in this report are preliminary and upon coder review may  be revised to meet current compliance requirements. Attending Participation:      I personally performed the entire procedure. Elfredia Nevins, DO Jaynie Collins DO, DO 06/22/2023 9:34:03 AM This report has been signed electronically. Number of Addenda: 0 Note Initiated On: 06/22/2023  9:18 AM Estimated Blood Loss:  Estimated blood loss was minimal.      [facility]

## 2023-06-22 NOTE — Transfer of Care (Signed)
Immediate Anesthesia Transfer of Care Note  Patient: Sarah Harrison  Procedure(s) Performed: ESOPHAGOGASTRODUODENOSCOPY (EGD) WITH PROPOFOL  Patient Location: PACU  Anesthesia Type:General  Level of Consciousness: awake, alert , and oriented  Airway & Oxygen Therapy: Patient Spontanous Breathing  Post-op Assessment: Report given to RN and Post -op Vital signs reviewed and stable  Post vital signs: Reviewed and stable  Last Vitals:  Vitals Value Taken Time  BP 126/62 06/22/23 0936  Temp 36.2 C 06/22/23 0936  Pulse 71 06/22/23 0936  Resp 15 06/22/23 0936  SpO2 99 % 06/22/23 0936    Last Pain:  Vitals:   06/22/23 0936  TempSrc: Temporal  PainSc: 0-No pain         Complications: No notable events documented.

## 2023-06-22 NOTE — Anesthesia Preprocedure Evaluation (Signed)
Anesthesia Evaluation  Patient identified by MRN, date of birth, ID band Patient awake    Reviewed: Allergy & Precautions, NPO status , Patient's Chart, lab work & pertinent test results  History of Anesthesia Complications (+) history of anesthetic complications  Airway Mallampati: III  TM Distance: >3 FB Neck ROM: full    Dental  (+) Teeth Intact, Dental Advidsory Given   Pulmonary neg shortness of breath, asthma , sleep apnea and Continuous Positive Airway Pressure Ventilation , COPD, neg recent URI, former smoker   Pulmonary exam normal breath sounds clear to auscultation       Cardiovascular Exercise Tolerance: Good hypertension, Pt. on medications (-) angina (-) Past MI and (-) Cardiac Stents negative cardio ROS Normal cardiovascular exam(-) dysrhythmias (-) Valvular Problems/Murmurs Rhythm:Regular Rate:Normal     Neuro/Psych  PSYCHIATRIC DISORDERS Anxiety Depression    negative neurological ROS     GI/Hepatic negative GI ROS, Neg liver ROS,GERD  ,,  Endo/Other  negative endocrine ROS    Renal/GU negative Renal ROS  negative genitourinary   Musculoskeletal  (+) Arthritis ,    Abdominal  (+)   Bowel sounds: normal.  Peds negative pediatric ROS (+)  Hematology negative hematology ROS (+)   Anesthesia Other Findings Past Medical History: No date: Arthritis No date: Asthma No date: Complication of anesthesia No date: Hypertension  Past Surgical History: No date: CESAREAN SECTION No date: CHOLECYSTECTOMY 06/15/2015: COLONOSCOPY WITH PROPOFOL; N/A     Comment:  Procedure: COLONOSCOPY WITH PROPOFOL;  Surgeon: Scot Jun, MD;  Location: [facility] ENDOSCOPY;  Service:               Endoscopy;  Laterality: N/A; 12/30/2019: COLONOSCOPY WITH PROPOFOL; N/A     Comment:  Procedure: COLONOSCOPY WITH PROPOFOL;  Surgeon: Midge Minium, MD;  Location: [facility] ENDOSCOPY;  Service:                Endoscopy;  Laterality: N/A; No date: urethrel dialation     Comment:  age 77  BMI    Body Mass Index: 28.56 kg/m      Reproductive/Obstetrics negative OB ROS                             Anesthesia Physical Anesthesia Plan  ASA: 2  Anesthesia Plan: General   Post-op Pain Management:    Induction: Intravenous  PONV Risk Score and Plan: 3 and Propofol infusion and TIVA  Airway Management Planned: Natural Airway and Nasal Cannula  Additional Equipment:   Intra-op Plan:   Post-operative Plan:   Informed Consent: I have reviewed the patients History and Physical, chart, labs and discussed the procedure including the risks, benefits and alternatives for the proposed anesthesia with the patient or authorized representative who has indicated his/her understanding and acceptance.     Dental Advisory Given  Plan Discussed with: Anesthesiologist, CRNA and Surgeon  Anesthesia Plan Comments:         Anesthesia Quick Evaluation

## 2023-06-23 ENCOUNTER — Encounter: Payer: Self-pay | Admitting: Gastroenterology

## 2023-06-28 NOTE — Anesthesia Postprocedure Evaluation (Signed)
Anesthesia Post Note  Patient: Sarah Harrison  Procedure(s) Performed: ESOPHAGOGASTRODUODENOSCOPY (EGD) WITH PROPOFOL BIOPSY  Patient location during evaluation: Endoscopy Anesthesia Type: General Level of consciousness: awake and alert Pain management: pain level controlled Vital Signs Assessment: post-procedure vital signs reviewed and stable Respiratory status: spontaneous breathing, nonlabored ventilation, respiratory function stable and patient connected to nasal cannula oxygen Cardiovascular status: blood pressure returned to baseline and stable Postop Assessment: no apparent nausea or vomiting Anesthetic complications: no   No notable events documented.   Last Vitals:  Vitals:   06/22/23 0946 06/22/23 0956  BP: (!) 144/72 (!) 179/73  Pulse: 71 67  Resp: 18 16  Temp: (!) 36.2 C (!) 36.2 C  SpO2: 97% 100%    Last Pain:  Vitals:   06/22/23 0956  TempSrc: Temporal  PainSc: 0-No pain                 Lenard Simmer

## 2023-10-03 ENCOUNTER — Ambulatory Visit
Admission: RE | Admit: 2023-10-03 | Discharge: 2023-10-03 | Disposition: A | Discharge status: Home / Self Care | Payer: Medicare PPO | Source: Ambulatory Visit | Attending: Family Medicine | Admitting: Family Medicine

## 2023-10-03 ENCOUNTER — Other Ambulatory Visit: Payer: Self-pay | Admitting: Family Medicine

## 2023-10-03 DIAGNOSIS — R55 Syncope and collapse: Secondary | ICD-10-CM | POA: Insufficient documentation

## 2023-10-03 DIAGNOSIS — Y92009 Unspecified place in unspecified non-institutional (private) residence as the place of occurrence of the external cause: Secondary | ICD-10-CM | POA: Diagnosis not present

## 2023-10-03 DIAGNOSIS — R110 Nausea: Secondary | ICD-10-CM | POA: Insufficient documentation

## 2023-10-03 DIAGNOSIS — R519 Headache, unspecified: Secondary | ICD-10-CM

## 2023-10-03 DIAGNOSIS — W19XXXA Unspecified fall, initial encounter: Secondary | ICD-10-CM

## 2024-07-26 ENCOUNTER — Other Ambulatory Visit: Payer: Self-pay | Admitting: Orthopedic Surgery

## 2024-08-08 ENCOUNTER — Encounter
Admission: RE | Admit: 2024-08-08 | Discharge: 2024-08-08 | Disposition: A | Discharge status: Home / Self Care | Source: Ambulatory Visit | Attending: Orthopedic Surgery | Admitting: Orthopedic Surgery

## 2024-08-08 ENCOUNTER — Other Ambulatory Visit: Payer: Self-pay

## 2024-08-08 DIAGNOSIS — Z01818 Encounter for other preprocedural examination: Secondary | ICD-10-CM | POA: Diagnosis present

## 2024-08-08 HISTORY — DX: Prediabetes: R73.03

## 2024-08-08 HISTORY — DX: Gastro-esophageal reflux disease without esophagitis: K21.9

## 2024-08-08 HISTORY — DX: Family history of other specified conditions: Z84.89

## 2024-08-08 HISTORY — DX: Hemangioma of intra-abdominal structures: D18.03

## 2024-08-08 HISTORY — DX: Other reaction to spinal and lumbar puncture: G97.1

## 2024-08-08 LAB — CBC WITH DIFFERENTIAL/PLATELET
Abs Immature Granulocytes: 0.07 K/uL (ref 0.00–0.07)
Basophils Absolute: 0 K/uL (ref 0.0–0.1)
Basophils Relative: 0 %
Eosinophils Absolute: 0 K/uL (ref 0.0–0.5)
Eosinophils Relative: 0 %
HCT: 43.8 % (ref 36.0–46.0)
Hemoglobin: 14.1 g/dL (ref 12.0–15.0)
Immature Granulocytes: 1 %
Lymphocytes Relative: 23 %
Lymphs Abs: 1.9 K/uL (ref 0.7–4.0)
MCH: 30.9 pg (ref 26.0–34.0)
MCHC: 32.2 g/dL (ref 30.0–36.0)
MCV: 96.1 fL (ref 80.0–100.0)
Monocytes Absolute: 0.6 K/uL (ref 0.1–1.0)
Monocytes Relative: 7 %
Neutro Abs: 5.8 K/uL (ref 1.7–7.7)
Neutrophils Relative %: 69 %
Platelets: 256 K/uL (ref 150–400)
RBC: 4.56 MIL/uL (ref 3.87–5.11)
RDW: 12.6 % (ref 11.5–15.5)
WBC: 8.3 K/uL (ref 4.0–10.5)
nRBC: 0 % (ref 0.0–0.2)

## 2024-08-08 LAB — SURGICAL PCR SCREEN
MRSA, PCR: NEGATIVE
Staphylococcus aureus: NEGATIVE

## 2024-08-08 LAB — COMPREHENSIVE METABOLIC PANEL WITH GFR
ALT: 18 U/L (ref 0–44)
AST: 15 U/L (ref 15–41)
Albumin: 4.1 g/dL (ref 3.5–5.0)
Alkaline Phosphatase: 45 U/L (ref 38–126)
Anion gap: 8 (ref 5–15)
BUN: 17 mg/dL (ref 8–23)
CO2: 28 mmol/L (ref 22–32)
Calcium: 9.7 mg/dL (ref 8.9–10.3)
Chloride: 104 mmol/L (ref 98–111)
Creatinine, Ser: 0.66 mg/dL (ref 0.44–1.00)
GFR, Estimated: >60 mL/min (ref >60)
Glucose, Bld: 95 mg/dL (ref 70–99)
Potassium: 3.8 mmol/L (ref 3.5–5.1)
Sodium: 140 mmol/L (ref 135–145)
Total Bilirubin: 0.8 mg/dL (ref 0.0–1.2)
Total Protein: 7.3 g/dL (ref 6.5–8.1)

## 2024-08-08 LAB — URINALYSIS, ROUTINE W REFLEX MICROSCOPIC
Bilirubin Urine: NEGATIVE
Glucose, UA: NEGATIVE mg/dL
Hgb urine dipstick: NEGATIVE
Ketones, ur: NEGATIVE mg/dL
Leukocytes,Ua: NEGATIVE
Nitrite: NEGATIVE
Protein, ur: NEGATIVE mg/dL
Specific Gravity, Urine: 1.02 (ref 1.005–1.030)
pH: 5 (ref 5.0–8.0)

## 2024-08-08 MED ORDER — BISOPROLOL FUMARATE 5 MG PO TABS
5.0000 mg | ORAL_TABLET | Freq: Once | ORAL | Status: DC
  Filled 2024-08-08: qty 1

## 2024-08-08 NOTE — Patient Instructions (Addendum)
 Your procedure is scheduled on: Thursday 08/15/24 Report to the Registration Desk on the 1st floor of [facility]. To find out your arrival time, please call [phone] between 1PM - 3PM on: Wednesday 08/14/24 If your arrival time is 6:00 am, do not arrive before that time as the [facility] entrance doors do not open until 6:00 am.  REMEMBER: Instructions that are not followed completely may result in serious medical risk, up to and including death; or upon the discretion of your surgeon and anesthesiologist your surgery may need to be rescheduled.  Do not eat food after midnight the night before surgery.  No gum chewing or hard candies.  You may however, drink CLEAR liquids up to 2 hours before you are scheduled to arrive for your surgery. Do not drink anything within 2 hours of your scheduled arrival time.  Clear liquids include: - water  - apple juice without pulp - gatorade (not RED colors) - black coffee or tea (Do NOT add milk or creamers to the coffee or tea) Do NOT drink anything that is not on this list.  In addition, your doctor has ordered for you to drink the provided:  Ensure Pre-Surgery Clear Carbohydrate Drink  Drinking this carbohydrate drink up to two hours before surgery helps to reduce insulin resistance and improve patient outcomes. Please complete drinking 2 hours before scheduled arrival time.  One week prior to surgery: Stop Anti-inflammatories (NSAIDS) such as Advil, Aleve, Ibuprofen, Motrin, Naproxen, Naprosyn and Aspirin based products such as Excedrin, Goody's Powder, BC Powder. You may however, continue to take Tylenol if needed for pain up until the day of surgery.  Stop ANY OVER THE COUNTER supplements and vitamins until after surgery.  Continue taking all of your other prescription medications up until the day of surgery.  ON THE DAY OF SURGERY ONLY TAKE THESE MEDICATIONS WITH SIPS OF WATER:  Pantoprazole/protonix Ceterizine/zyrtec if  needed  Use inhalers on the day of surgery and bring to the hospital.  No Alcohol for 24 hours before or after surgery.  No Smoking including e-cigarettes for 24 hours before surgery.  No chewable tobacco products for at least 6 hours before surgery.  No nicotine patches on the day of surgery.  Do not use any recreational drugs for at least a week (preferably 2 weeks) before your surgery.  Please be advised that the combination of cocaine and anesthesia may have negative outcomes, up to and including death. If you test positive for cocaine, your surgery will be cancelled.  On the morning of surgery brush your teeth with toothpaste and water, you may rinse your mouth with mouthwash if you wish. Do not swallow any toothpaste or mouthwash.  Use CHG Soap or wipes as directed on instruction sheet.  Do not shave body hair from the neck down 48 hours before surgery.  Do not wear lotions, powders, or perfumes on the day of surgery.   Do not wear jewelry, make-up, hairpins, clips or nail polish.  For welded (permanent) jewelry: bracelets, anklets, waist bands, etc.  Please have this removed prior to surgery.  If it is not removed, there is a chance that hospital personnel will need to cut it off on the day of surgery.  Contact lenses, hearing aids and dentures may not be worn into surgery. Bring a case for your glasses  Do not bring valuables to the hospital. [facility] is not responsible for any missing/lost belongings or valuables.   Bring your C-PAP to the  hospital in case you may have to spend the night.   Notify your doctor Dela) if there is any change in your medical condition (cold, fever, infection).  Wear comfortable clothing (specific to your surgery type) to the hospital.  After surgery, you can help prevent lung complications by doing breathing exercises.  Take deep breaths and cough every 1-2 hours. Your doctor may order a device called an Incentive Spirometer to help  you take deep breaths.  If you are being admitted to the hospital overnight, leave your suitcase in the car. After surgery it may be brought to your room.  In case of increased patient census, it may be necessary for you, the patient, to continue your postoperative care in the Same Day Surgery department.  Please call the Pre-admissions Testing Dept. at [phone] if you have any questions about these instructions.  Surgery Visitation Policy:  Patients having surgery or a procedure may have two visitors.  Children under the age of 26 must have an adult with them who is not the patient.  Inpatient Visitation:    Visiting hours are 7 a.m. to 8 p.m. Up to four visitors are allowed at one time in a patient room. The visitors may rotate out with other people during the day.  One visitor age 66 or older may stay with the patient overnight and must be in the room by 8 p.m.   Merchandiser, retail to address health-related social needs:  [email or web address]    Pre-operative 4 CHG Bath Instructions   You can play a key role in reducing the risk of infection after surgery. Your skin needs to be as free of germs as possible. You can reduce the number of germs on your skin by washing with CHG (chlorhexidine gluconate) soap before surgery. CHG is an antiseptic soap that kills germs and continues to kill germs even after washing.   DO NOT use if you have an allergy to chlorhexidine/CHG or antibacterial soaps. If your skin becomes reddened or irritated, stop using the CHG and notify one of our RNs at [phone].   Please shower with the CHG soap starting 4 days before surgery using the following schedule:   Sunday 08/11/24 - Wednesday 08/14/24    Please keep in mind the following:  DO NOT shave, including legs and underarms, starting the day of your first shower.   You may shave your face at any point before/day of surgery.  Place clean sheets on your bed the day you  start using CHG soap. Use a clean washcloth (not used since being washed) for each shower. DO NOT sleep with pets once you start using the CHG.   CHG Shower Instructions:  If you choose to wash your hair and private area, wash first with your normal shampoo/soap.  After you use shampoo/soap, rinse your hair and body thoroughly to remove shampoo/soap residue.  Turn the water OFF and apply about 3 tablespoons (45 ml) of CHG soap to a CLEAN washcloth.  Apply CHG soap ONLY FROM YOUR NECK DOWN TO YOUR TOES (washing for 3-5 minutes)  DO NOT use CHG soap on face, private areas, open wounds, or sores.  Pay special attention to the area where your surgery is being performed.  If you are having back surgery, having someone wash your back for you may be helpful. Wait 2 minutes after CHG soap is applied, then you may rinse off the CHG soap.  Pat dry with a clean towel  Put on clean clothes/pajamas  If you choose to wear lotion, please use ONLY the CHG-compatible lotions on the back of this paper.     Additional instructions for the day of surgery: DO NOT APPLY any lotions, deodorants, cologne, or perfumes.   Put on clean/comfortable clothes.  Brush your teeth.  Ask your nurse before applying any prescription medications to the skin.      CHG Compatible Lotions   Aveeno Moisturizing lotion  Cetaphil Moisturizing Cream  Cetaphil Moisturizing Lotion  Clairol Herbal Essence Moisturizing Lotion, Dry Skin  Clairol Herbal Essence Moisturizing Lotion, Extra Dry Skin  Clairol Herbal Essence Moisturizing Lotion, Normal Skin  Curel Age Defying Therapeutic Moisturizing Lotion with Alpha Hydroxy  Curel Extreme Care Body Lotion  Curel Soothing Hands Moisturizing Hand Lotion  Curel Therapeutic Moisturizing Cream, Fragrance-Free  Curel Therapeutic Moisturizing Lotion, Fragrance-Free  Curel Therapeutic Moisturizing Lotion, Original Formula  Eucerin Daily Replenishing Lotion  Eucerin Dry Skin Therapy  Plus Alpha Hydroxy Crme  Eucerin Dry Skin Therapy Plus Alpha Hydroxy Lotion  Eucerin Original Crme  Eucerin Original Lotion  Eucerin Plus Crme Eucerin Plus Lotion  Eucerin TriLipid Replenishing Lotion  Keri Anti-Bacterial Hand Lotion  Keri Deep Conditioning Original Lotion Dry Skin Formula Softly Scented  Keri Deep Conditioning Original Lotion, Fragrance Free Sensitive Skin Formula  Keri Lotion Fast Absorbing Fragrance Free Sensitive Skin Formula  Keri Lotion Fast Absorbing Softly Scented Dry Skin Formula  Keri Original Lotion  Keri Skin Renewal Lotion Keri Silky Smooth Lotion  Keri Silky Smooth Sensitive Skin Lotion  Nivea Body Creamy Conditioning Oil  Nivea Body Extra Enriched Lotion  Nivea Body Original Lotion  Nivea Body Sheer Moisturizing Lotion Nivea Crme  Nivea Skin Firming Lotion  NutraDerm 30 Skin Lotion  NutraDerm Skin Lotion  NutraDerm Therapeutic Skin Cream  NutraDerm Therapeutic Skin Lotion  ProShield Protective Hand Cream  Provon moisturizing lotion  How to Use an Incentive Spirometer  An incentive spirometer is a tool that measures how well you are filling your lungs with each breath. Learning to take long, deep breaths using this tool can help you keep your lungs clear and active. This may help to reverse or lessen your chance of developing breathing (pulmonary) problems, especially infection. You may be asked to use a spirometer: After a surgery. If you have a lung problem or a history of smoking. After a long period of time when you have been unable to move or be active. If the spirometer includes an indicator to show the highest number that you have reached, your health care provider or respiratory therapist will help you set a goal. Keep a log of your progress as told by your health care provider. What are the risks? Breathing too quickly may cause dizziness or cause you to pass out. Take your time so you do not get dizzy or light-headed. If you are in pain,  you may need to take pain medicine before doing incentive spirometry. It is harder to take a deep breath if you are having pain. How to use your incentive spirometer  Sit up on the edge of your bed or on a chair. Hold the incentive spirometer so that it is in an upright position. Before you use the spirometer, breathe out normally. Place the mouthpiece in your mouth. Make sure your lips are closed tightly around it. Breathe in slowly and as deeply as you can through your mouth, causing the piston or the ball to rise toward the top of the chamber. Hold your breath for 3-5 seconds, or  for as long as possible. If the spirometer includes a coach indicator, use this to guide you in breathing. Slow down your breathing if the indicator goes above the marked areas. Remove the mouthpiece from your mouth and breathe out normally. The piston or ball will return to the bottom of the chamber. Rest for a few seconds, then repeat the steps 10 or more times. Take your time and take a few normal breaths between deep breaths so that you do not get dizzy or light-headed. Do this every 1-2 hours when you are awake. If the spirometer includes a goal marker to show the highest number you have reached (best effort), use this as a goal to work toward during each repetition. After each set of 10 deep breaths, cough a few times. This will help to make sure that your lungs are clear. If you have an incision on your chest or abdomen from surgery, place a pillow or a rolled-up towel firmly against the incision when you cough. This can help to reduce pain while taking deep breaths and coughing. General tips When you are able to get out of bed: Walk around often. Continue to take deep breaths and cough in order to clear your lungs. Keep using the incentive spirometer until your health care provider says it is okay to stop using it. If you have been in the hospital, you may be told to keep using the spirometer at  home. Contact a health care provider if: You are having difficulty using the spirometer. You have trouble using the spirometer as often as instructed. Your pain medicine is not giving enough relief for you to use the spirometer as told. You have a fever. Get help right away if: You develop shortness of breath. You develop a cough with bloody mucus from the lungs. You have fluid or blood coming from an incision site after you cough. Summary An incentive spirometer is a tool that can help you learn to take long, deep breaths to keep your lungs clear and active. You may be asked to use a spirometer after a surgery, if you have a lung problem or a history of smoking, or if you have been inactive for a long period of time. Use your incentive spirometer as instructed every 1-2 hours while you are awake. If you have an incision on your chest or abdomen, place a pillow or a rolled-up towel firmly against your incision when you cough. This will help to reduce pain. Get help right away if you have shortness of breath, you cough up bloody mucus, or blood comes from your incision when you cough. This information is not intended to replace advice given to you by your health care provider. Make sure you discuss any questions you have with your health care provider. Document Revised: 01/06/2020 Document Reviewed: 01/06/2020 Elsevier Patient Education  2023 [employer].

## 2024-08-12 MED ORDER — PROPOFOL 1000 MG/100ML IV EMUL
INTRAVENOUS | Status: AC
  Filled 2024-08-12: qty 100

## 2024-08-13 ENCOUNTER — Encounter: Payer: Self-pay | Admitting: Orthopedic Surgery

## 2024-08-13 MED ORDER — SODIUM CHLORIDE (PF) 0.9 % IJ SOLN
INTRAMUSCULAR | Status: AC
  Filled 2024-08-13: qty 10

## 2024-08-13 NOTE — Progress Notes (Signed)
 Perioperative / Anesthesia Services  Pre-Admission Testing Clinical Review / Pre-Operative Anesthesia Consult  Date: 08/13/24  PATIENT DEMOGRAPHICS: Name: Sarah Harrison DOB: 05-04-1946 MRN:   [id number]  Note: Available PAT nursing documentation and vital signs have been reviewed. Clinical nursing staff has updated patient's PMH/PSHx, current medication list, and drug allergies/intolerances to ensure complete and comprehensive history available to assist care teams in MDM as it pertains to the aforementioned surgical procedure and anticipated anesthetic course. Extensive review of available clinical information personally performed. Nursing documentation reviewed. [place] PMH and PSHx updated with any diagnoses and/or procedures that I have knowledge of that may have been inadvertently omitted during her intake with the pre-admission testing department's nursing staff.  PLANNED SURGICAL PROCEDURE(S):   Case: [id number] Date/Time: 08/15/24 0715   Procedure: ARTHROPLASTY, KNEE, TOTAL (Left: Knee)   Anesthesia type: Choice   Diagnosis: Osteoarthritis of left knee, unspecified osteoarthritis type [M17.12]   Pre-op diagnosis: Osteoarthritis of left knee, unspecified osteoarthritis type M17.12   Location: [facility] OR ROOM 01 / [facility]   Surgeons: Lorelle Hussar, MD        CLINICAL DISCUSSION: Sarah Harrison is a 78 y.o. female who is submitted for pre-surgical anesthesia review and clearance prior to her undergoing the above procedure. Patient is a Former Smoker (quit 10/1986). Pertinent PMH includes: CAD, incomplete RBBB, sinus bradycardia, palpitations, aortic atherosclerosis, HTN, HLD, prediabetes, asthma, COPD, OSAH (on nocturnal PAP therapy), GERD with chronic gastritis (on daily PPI), alpha gal syndrome, OA, fibromyalgia, anxiety, depression.  Patient is followed by cardiology Andrea, MD). She was last seen in the cardiology clinic on 201 25; notes  reviewed. At the time of her clinic visit, patient doing well overall from a cardiovascular perspective.  Patient with complaints of chronic mild exertional dyspnea related to her underlying COPD diagnosis.  Respiratory status stable and at baseline.  Patient denied any chest pain, PND, orthopnea, palpitations, significant peripheral edema, weakness, fatigue, vertiginous symptoms, or presyncope/syncope. Patient with a past medical history significant for cardiovascular diagnoses. Documented physical exam was grossly benign, providing no evidence of acute exacerbation and/or decompensation of the patient's known cardiovascular conditions.  Stress echocardiogram was performed on 07/27/2020 revealing normal biventricular function with an EF of >55%.  There were no regional wall motion abnormalities.  There is trivial to mild pan valvular regurgitation.  Normal gradients; no valvular stenosis.  Aorta normal in size with no evidence of ectasia or aneurysmal dilatation.  Coronary CTA was performed on 08/11/2022 that demonstrated an Agatston coronary artery calcium score of 93.8. This placed patient in the 59th percentile for age, sex, and race matched controls. Calcium depositions noted to be isolated mainly in the proximal LAD distributions.  Study demonstrated normal coronary origin with RIGHT dominance.  Study also revealed aortic atherosclerosis and evidence of hepatic steatosis.  Blood pressure well controlled at 126/78 mmHg; takes no antihypertensives. Patient is on rosuvastatin for her HLD diagnosis and ASCVD prevention.  Patient has a prediabetes diagnosis that she is managing with diet and lifestyle modifications.  Most recent hemoglobin A1c was 6.1% when checked on 07/26/2024. She does have an OSAH diagnosis and is reported to be compliant with prescribed nocturnal PAP therapy. Patient is able to complete all of her  ADL/IADLs without cardiovascular limitation.  Per the [facility], patient is able to achieve at  least 4 METS of physical activity without experiencing any significant degree of angina/anginal equivalent symptoms. No changes were made to her medication regimen during her visit  with cardiology.  Patient scheduled to follow-up with outpatient cardiology in 6 months or sooner if needed.  Sarah Harrison is scheduled for an elective ARTHROPLASTY, KNEE, TOTAL (Left: Knee) on 08/15/2024 with Dr. Arthea Sheer, MD. Given patient's past medical history significant for cardiovascular diagnoses, presurgical cardiac clearance was sought by the PAT team. Per cardiology, this patient is optimized for surgery and may proceed with the planned procedural course with a LOW risk of significant perioperative cardiovascular complications.  In review of her medication reconciliation, the patient is not noted to be taking any type of anticoagulation or antiplatelet therapies that would need to be held during her perioperative course.  Patient denies previous perioperative complications with anesthesia in the past. In review her EMR, it is noted that patient underwent a general anesthetic course here at [facility] ([facility] in 06/2023 without documented complications.   MOST RECENT VITAL SIGNS:    08/08/2024    8:49 AM 06/22/2023    9:56 AM 06/22/2023    9:46 AM  Vitals with BMI  Height 5' 4    Weight 159 lbs    BMI 27.28    Systolic 149 179 855  Diastolic 66 73 72  Pulse 54 67 71   PROVIDERS/SPECIALISTS: NOTE: Primary physician provider listed below. Patient may have been seen by APP or partner within same practice.   PROVIDER ROLE / SPECIALTY LAST SHERLEAN Sheer Arthea, MD Orthopedics (Surgeon) 08/06/2024  Auston Reyes BIRCH, MD Primary Care Provider 07/25/2024  Ammon Blunt, MD Cardiology 07/31/2024   ALLERGIES: Allergies  Allergen Reactions   Alpha-Gal Anaphylaxis   Bovine (Beef) Protein-Containing Drug Products Anaphylaxis   Penicillins Anaphylaxis    Porcine (Pork) Protein-Containing Drug Products Anaphylaxis   Bee Venom Swelling   Elemental Sulfur     Muscle cramps   Sudafed [Pseudoephedrine Hcl]     Dizzy and disoriented    CURRENT HOME MEDICATIONS: No current facility-administered medications for this encounter.    albuterol (PROVENTIL HFA;VENTOLIN HFA) 108 (90 BASE) MCG/ACT inhaler   bisoprolol-hydrochlorothiazide (ZIAC) 5-6.25 MG tablet   calcium carbonate (TUMS EX) 750 MG chewable tablet   cetirizine (ZYRTEC) 10 MG tablet   Cholecalciferol 50 MCG (2000 UT) TABS   diclofenac Sodium (VOLTAREN) 1 % GEL   diphenhydrAMINE (BENADRYL) 25 MG tablet   EPINEPHrine 0.3 mg/0.3 mL IJ SOAJ injection   NON FORMULARY   pantoprazole (PROTONIX) 40 MG tablet   Polyethyl Glyc-Propyl Glyc PF (SYSTANE ULTRA PF) 0.4-0.3 % SOLN   rosuvastatin (CRESTOR) 5 MG tablet   HISTORY: Past Medical History:  Diagnosis Date   Alpha-gal syndrome    Anxiety    Aortic atherosclerosis    Arthritis    Asthma    Baker's cyst of knee, left 03/23/2023   CAD (coronary artery disease)    Chronic gastritis    Colon polyps    Complication of anesthesia    a.) reaction following colonoscopy that is described as feelings of impending doom   COPD (chronic obstructive pulmonary disease) (HCC)    Depression    Family history of adverse reaction to anesthesia    a.) PONV in 1st degree female relative (sister)   Fibromyalgia    GERD (gastroesophageal reflux disease)    Hemangioma of liver    Hepatic steatosis    HLD (hyperlipidemia)    Hypertension    Incomplete RBBB    OSA on CPAP    Palpitations    Post-dural puncture headache following Cesarean  Pre-diabetes    Right ovarian cyst    Seasonal allergic rhinitis    Sinus bradycardia    Vitamin D deficiency    Past Surgical History:  Procedure Laterality Date   BIOPSY  06/22/2023   Procedure: BIOPSY;  Surgeon: Onita Elspeth Sharper, DO;  Location: [facility] ENDOSCOPY;  Service: Gastroenterology;;    BREAST BIOPSY Right    benign   CESAREAN SECTION     CHOLECYSTECTOMY     COLONOSCOPY WITH PROPOFOL  N/A 06/15/2015   Procedure: COLONOSCOPY WITH PROPOFOL ;  Surgeon: Lamar ONEIDA Holmes, MD;  Location: [facility] ENDOSCOPY;  Service: Endoscopy;  Laterality: N/A;   COLONOSCOPY WITH PROPOFOL  N/A 12/30/2019   Procedure: COLONOSCOPY WITH PROPOFOL ;  Surgeon: Jinny Carmine, MD;  Location: [facility] ENDOSCOPY;  Service: Endoscopy;  Laterality: N/A;   DILATION AND CURETTAGE OF UTERUS     ESOPHAGOGASTRODUODENOSCOPY N/A 02/11/2022   Procedure: ESOPHAGOGASTRODUODENOSCOPY (EGD);  Surgeon: Onita Elspeth Sharper, DO;  Location: [facility] ENDOSCOPY;  Service: Gastroenterology;  Laterality: N/A;   ESOPHAGOGASTRODUODENOSCOPY (EGD) WITH PROPOFOL  N/A 06/22/2023   Procedure: ESOPHAGOGASTRODUODENOSCOPY (EGD) WITH PROPOFOL ;  Surgeon: Onita Elspeth Sharper, DO;  Location: [facility] ENDOSCOPY;  Service: Gastroenterology;  Laterality: N/A;   HAND SURGERY Right    NEUROMA SURGERY     urethrel dialation     age 46   No family history on file. Social History   Tobacco Use   Smoking status: Former    Current packs/day: 0.00    Types: Cigarettes    Quit date: 10/31/1986    Years since quitting: 37.8   Smokeless tobacco: Never  Substance Use Topics   Alcohol use: Yes    Alcohol/week: 14.0 standard drinks of alcohol    Types: 7 Glasses of wine, 7 Shots of liquor per week    Comment: OCC. none last 24hr   LABS:  Hospital Outpatient Visit on 08/08/2024  Component Date Value Ref Range Status   MRSA, PCR 08/08/2024 NEGATIVE  NEGATIVE Final   Staphylococcus aureus 08/08/2024 NEGATIVE  NEGATIVE Final   Comment: (NOTE) The Xpert SA Assay (FDA approved for NASAL specimens in patients 45 years of age and older), is one component of a comprehensive surveillance program. It is not intended to diagnose infection nor to guide or monitor treatment. Performed at [facility], [address] [place], [place] [address]    WBC  08/08/2024 8.3  4.0 - 10.5 K/uL Final   RBC 08/08/2024 4.56  3.87 - 5.11 MIL/uL Final   Hemoglobin 08/08/2024 14.1  12.0 - 15.0 g/dL Final   HCT 89/90/7974 43.8  36.0 - 46.0 % Final   MCV 08/08/2024 96.1  80.0 - 100.0 fL Final   MCH 08/08/2024 30.9  26.0 - 34.0 pg Final   MCHC 08/08/2024 32.2  30.0 - 36.0 g/dL Final   RDW 89/90/7974 12.6  11.5 - 15.5 % Final   Platelets 08/08/2024 256  150 - 400 K/uL Final   nRBC 08/08/2024 0.0  0.0 - 0.2 % Final   Neutrophils Relative % 08/08/2024 69  % Final   Neutro Abs 08/08/2024 5.8  1.7 - 7.7 K/uL Final   Lymphocytes Relative 08/08/2024 23  % Final   Lymphs Abs 08/08/2024 1.9  0.7 - 4.0 K/uL Final   Monocytes Relative 08/08/2024 7  % Final   Monocytes Absolute 08/08/2024 0.6  0.1 - 1.0 K/uL Final   Eosinophils Relative 08/08/2024 0  % Final   Eosinophils Absolute 08/08/2024 0.0  0.0 - 0.5 K/uL Final   Basophils Relative 08/08/2024 0  %  Final   Basophils Absolute 08/08/2024 0.0  0.0 - 0.1 K/uL Final   Immature Granulocytes 08/08/2024 1  % Final   Abs Immature Granulocytes 08/08/2024 0.07  0.00 - 0.07 K/uL Final   Performed at [facility], [address] [place], [place] [address]   Sodium 08/08/2024 140  135 - 145 mmol/L Final   Potassium 08/08/2024 3.8  3.5 - 5.1 mmol/L Final   Chloride 08/08/2024 104  98 - 111 mmol/L Final   CO2 08/08/2024 28  22 - 32 mmol/L Final   Glucose, Bld 08/08/2024 95  70 - 99 mg/dL Final   Glucose reference range applies only to samples taken after fasting for at least 8 hours.   BUN 08/08/2024 17  8 - 23 mg/dL Final   Creatinine, Ser 08/08/2024 0.66  0.44 - 1.00 mg/dL Final   Calcium 89/90/7974 9.7  8.9 - 10.3 mg/dL Final   Total Protein 89/90/7974 7.3  6.5 - 8.1 g/dL Final   Albumin 89/90/7974 4.1  3.5 - 5.0 g/dL Final   AST 89/90/7974 15  15 - 41 U/L Final   ALT 08/08/2024 18  0 - 44 U/L Final   Alkaline Phosphatase 08/08/2024 45  38 - 126 U/L Final   Total Bilirubin 08/08/2024 0.8  0.0 - 1.2 mg/dL  Final   GFR, Estimated 08/08/2024 >60  >60 mL/min Final   Comment: (NOTE) Calculated using the CKD-EPI Creatinine Equation (2021)    Anion gap 08/08/2024 8  5 - 15 Final   Performed at [facility], [address] [place], [place] [address]   Color, Urine 08/08/2024 YELLOW (A)  YELLOW Final   APPearance 08/08/2024 CLEAR (A)  CLEAR Final   Specific Gravity, Urine 08/08/2024 1.020  1.005 - 1.030 Final   pH 08/08/2024 5.0  5.0 - 8.0 Final   Glucose, UA 08/08/2024 NEGATIVE  NEGATIVE mg/dL Final   Hgb urine dipstick 08/08/2024 NEGATIVE  NEGATIVE Final   Bilirubin Urine 08/08/2024 NEGATIVE  NEGATIVE Final   Ketones, ur 08/08/2024 NEGATIVE  NEGATIVE mg/dL Final   Protein, ur 89/90/7974 NEGATIVE  NEGATIVE mg/dL Final   Nitrite 89/90/7974 NEGATIVE  NEGATIVE Final   Leukocytes,Ua 08/08/2024 NEGATIVE  NEGATIVE Final   Performed at [facility], [address] [place], [place] [address]    ECG: Date: 08/08/2024 Time ECG obtained: 0953 AM Rate: 52 bpm Rhythm: Sinus bradycardia with PACs Axis (leads I and aVF): normal Intervals: PR 152 ms. QRS 90 ms. QTc 381 ms. ST segment and T wave changes: No evidence of acute T wave abnormalities or significant ST segment elevation or depression.  Evidence of a possible, age undetermined, prior infarct:  No Comparison: Similar to previous tracing obtained on 08/03/2022   IMAGING / PROCEDURES: DIAGNOSTIC RADIOGRAPHS OF LEFT KNEE performed on 07/03/2024 Moderate to severe degenerative changes with tricompartmental joint space narrowing and medial bone-on-bone articulation with lateral subluxation of the tibia relative to the femur.   There is osteophyte formation and sclerosis and early subchondral cyst formation. Kellgren-Lawrence grade 3/4.   AP, sunrise, and flexed PA of the right knee also show moderate degenerative changes with medial joint space narrowing with osteophyte formation and sclerosis as well as patellofemoral  degeneration.  Kellgren-Lawrence grade 3.  No fractures or dislocations noted in either knee.   CT CORONARY MORPH W/CTA COR W/SCORE W/CA W/CM &/OR WO/CM performed on 08/11/2022 Coronary calcium score of 93.8. This was 59th percentile for age and sex matched control. Normal coronary origin with right  dominance. Mild proximal LAD stenosis (25%). CAD-RADS 2.  Mild non-obstructive CAD (25-49%). Consider non-atherosclerotic causes of chest pain. Consider preventive therapy and risk factor modification.  MR KNEE LEFT WO CONTRAST performed on 05/17/2023 Complex tear of the posterior horn of medial meniscus with peripheral meniscal extrusion. Tiny radial tear of the free edge of the body of the medial meniscus. High-grade partial-thickness cartilage loss with areas of full-thickness cartilage loss of the medial femorotibial compartment with subchondral reactive marrow edema in the medial tibial plateau. Moderate joint effusion.   IMPRESSION AND PLAN: Sarah Harrison has been referred for pre-anesthesia review and clearance prior to her undergoing the planned anesthetic and procedural courses. Available labs, pertinent testing, and imaging results were personally reviewed by me in preparation for upcoming operative/procedural course. [facility] medical record has been updated following extensive record review and patient interview with PAT staff.   This patient has been appropriately cleared by cardiology with an overall LOW risk of patient experiencing significant perioperative cardiovascular complications. Based on clinical review performed today (08/13/24), barring any significant acute changes in the patient's overall condition, it is anticipated that she will be able to proceed with the planned surgical intervention. Any acute changes in clinical condition may necessitate her procedure being postponed and/or cancelled. Patient will meet with anesthesia team (MD and/or CRNA) on the day of her  procedure for preoperative evaluation/assessment. Questions regarding anesthetic course will be fielded at that time.   Pre-surgical instructions were reviewed with the patient during his PAT appointment, and questions were fielded to satisfaction by PAT clinical staff. She has been instructed on which medications that she will need to hold prior to surgery, as well as the ones that have been deemed safe/appropriate to take on the day of her procedure. As part of the general education provided by PAT, patient made aware both verbally and in writing, that she would need to abstain from the use of any illegal substances during her perioperative course. She was advised that failure to follow the provided instructions could necessitate case cancellation or result in serious perioperative complications up to and including death. Patient encouraged to contact PAT and/or her surgeon's office to discuss any questions or concerns that may arise prior to surgery; verbalized understanding.   Dorise Pereyra, MSN, APRN, FNP-C, CEN [facility]  Perioperative Services Nurse Practitioner Phone: [phone] Fax: [phone] 08/13/24 2:34 PM  NOTE: This note has been prepared using Dragon dictation software. Despite my best ability to proofread, there is always the potential that unintentional transcriptional errors may still occur from this process.

## 2024-08-15 ENCOUNTER — Ambulatory Visit

## 2024-08-15 ENCOUNTER — Ambulatory Visit: Payer: Self-pay | Admitting: Urgent Care

## 2024-08-15 ENCOUNTER — Other Ambulatory Visit: Payer: Self-pay

## 2024-08-15 ENCOUNTER — Ambulatory Visit
Admission: RE | Admit: 2024-08-15 | Discharge: 2024-08-16 | Disposition: A | Discharge status: Home Health | Source: Ambulatory Visit | Attending: Orthopedic Surgery | Admitting: Orthopedic Surgery

## 2024-08-15 ENCOUNTER — Encounter
Admission: RE | Disposition: A | Discharge status: Home Health | Payer: Self-pay | Source: Ambulatory Visit | Attending: Orthopedic Surgery

## 2024-08-15 ENCOUNTER — Encounter: Payer: Self-pay | Admitting: Orthopedic Surgery

## 2024-08-15 DIAGNOSIS — R519 Headache, unspecified: Secondary | ICD-10-CM | POA: Insufficient documentation

## 2024-08-15 DIAGNOSIS — E559 Vitamin D deficiency, unspecified: Secondary | ICD-10-CM | POA: Insufficient documentation

## 2024-08-15 DIAGNOSIS — K219 Gastro-esophageal reflux disease without esophagitis: Secondary | ICD-10-CM | POA: Diagnosis not present

## 2024-08-15 DIAGNOSIS — F419 Anxiety disorder, unspecified: Secondary | ICD-10-CM | POA: Insufficient documentation

## 2024-08-15 DIAGNOSIS — M1712 Unilateral primary osteoarthritis, left knee: Secondary | ICD-10-CM | POA: Insufficient documentation

## 2024-08-15 DIAGNOSIS — I2510 Atherosclerotic heart disease of native coronary artery without angina pectoris: Secondary | ICD-10-CM | POA: Insufficient documentation

## 2024-08-15 DIAGNOSIS — I700 Atherosclerosis of aorta: Secondary | ICD-10-CM | POA: Insufficient documentation

## 2024-08-15 DIAGNOSIS — E785 Hyperlipidemia, unspecified: Secondary | ICD-10-CM | POA: Insufficient documentation

## 2024-08-15 DIAGNOSIS — Z87891 Personal history of nicotine dependence: Secondary | ICD-10-CM | POA: Diagnosis not present

## 2024-08-15 DIAGNOSIS — J449 Chronic obstructive pulmonary disease, unspecified: Secondary | ICD-10-CM | POA: Insufficient documentation

## 2024-08-15 DIAGNOSIS — M797 Fibromyalgia: Secondary | ICD-10-CM | POA: Diagnosis not present

## 2024-08-15 DIAGNOSIS — Z01818 Encounter for other preprocedural examination: Secondary | ICD-10-CM

## 2024-08-15 DIAGNOSIS — K2950 Unspecified chronic gastritis without bleeding: Secondary | ICD-10-CM | POA: Insufficient documentation

## 2024-08-15 DIAGNOSIS — Z79899 Other long term (current) drug therapy: Secondary | ICD-10-CM | POA: Insufficient documentation

## 2024-08-15 DIAGNOSIS — I10 Essential (primary) hypertension: Secondary | ICD-10-CM | POA: Insufficient documentation

## 2024-08-15 DIAGNOSIS — Z7901 Long term (current) use of anticoagulants: Secondary | ICD-10-CM | POA: Insufficient documentation

## 2024-08-15 DIAGNOSIS — G4733 Obstructive sleep apnea (adult) (pediatric): Secondary | ICD-10-CM | POA: Insufficient documentation

## 2024-08-15 DIAGNOSIS — Z833 Family history of diabetes mellitus: Secondary | ICD-10-CM | POA: Insufficient documentation

## 2024-08-15 DIAGNOSIS — Z96652 Presence of left artificial knee joint: Secondary | ICD-10-CM

## 2024-08-15 DIAGNOSIS — F32A Depression, unspecified: Secondary | ICD-10-CM | POA: Diagnosis not present

## 2024-08-15 HISTORY — DX: Obstructive sleep apnea (adult) (pediatric): G47.33

## 2024-08-15 HISTORY — PX: TOTAL KNEE ARTHROPLASTY: SHX125

## 2024-08-15 HISTORY — DX: Other reaction to spinal and lumbar puncture: G97.1

## 2024-08-15 HISTORY — DX: Vitamin D deficiency, unspecified: E55.9

## 2024-08-15 HISTORY — DX: Allergy to mammalian meats: Z91.014

## 2024-08-15 HISTORY — DX: Palpitations: R00.2

## 2024-08-15 HISTORY — DX: Fibromyalgia: M79.7

## 2024-08-15 HISTORY — DX: Other seasonal allergic rhinitis: J30.2

## 2024-08-15 HISTORY — DX: Atherosclerotic heart disease of native coronary artery without angina pectoris: I25.10

## 2024-08-15 HISTORY — DX: Fatty (change of) liver, not elsewhere classified: K76.0

## 2024-08-15 HISTORY — DX: Atherosclerosis of aorta: I70.0

## 2024-08-15 HISTORY — DX: Polyp of colon: K63.5

## 2024-08-15 HISTORY — DX: Unspecified ovarian cyst, right side: N83.201

## 2024-08-15 HISTORY — DX: Bradycardia, unspecified: R00.1

## 2024-08-15 HISTORY — DX: Unspecified right bundle-branch block: I45.10

## 2024-08-15 SURGERY — ARTHROPLASTY, KNEE, TOTAL
Anesthesia: General | Site: Knee | Laterality: Left

## 2024-08-15 MED ORDER — PHENYLEPHRINE 80 MCG/ML (10ML) SYRINGE FOR IV PUSH (FOR BLOOD PRESSURE SUPPORT)
PREFILLED_SYRINGE | INTRAVENOUS | Status: DC | PRN
  Administered 2024-08-15 (×5): 80 ug via INTRAVENOUS
  Administered 2024-08-15: 160 ug via INTRAVENOUS
  Administered 2024-08-15 (×5): 80 ug via INTRAVENOUS

## 2024-08-15 MED ORDER — DEXMEDETOMIDINE HCL IN NACL 80 MCG/20ML IV SOLN
INTRAVENOUS | Status: DC | PRN
  Administered 2024-08-15: 4 ug via INTRAVENOUS

## 2024-08-15 MED ORDER — BUPIVACAINE-EPINEPHRINE (PF) 0.25% -1:200000 IJ SOLN
INTRAMUSCULAR | Status: AC
  Filled 2024-08-15: qty 60

## 2024-08-15 MED ORDER — ACETAMINOPHEN 10 MG/ML IV SOLN: 1000.0000 mg | Freq: Once | INTRAVENOUS | Status: DC | PRN

## 2024-08-15 MED ORDER — DEXAMETHASONE SOD PHOSPHATE PF 10 MG/ML IJ SOLN: 8.0000 mg | Freq: Once | INTRAMUSCULAR | Status: DC

## 2024-08-15 MED ORDER — FENTANYL CITRATE (PF) 100 MCG/2ML IJ SOLN
INTRAMUSCULAR | Status: DC | PRN
  Administered 2024-08-15 (×2): 25 ug via INTRAVENOUS

## 2024-08-15 MED ORDER — ACETAMINOPHEN 10 MG/ML IV SOLN
INTRAVENOUS | Status: AC
  Filled 2024-08-15: qty 100

## 2024-08-15 MED ORDER — SODIUM CHLORIDE (PF) 0.9 % IJ SOLN
INTRAMUSCULAR | Status: DC | PRN
  Administered 2024-08-15: 71 mL via INTRAMUSCULAR

## 2024-08-15 MED ORDER — ACETAMINOPHEN 325 MG PO TABS: 325.0000 mg | ORAL_TABLET | Freq: Four times a day (QID) | ORAL | Status: DC | PRN

## 2024-08-15 MED ORDER — OXYCODONE HCL 5 MG/5ML PO SOLN: 5.0000 mg | Freq: Once | ORAL | Status: DC | PRN

## 2024-08-15 MED ORDER — ALBUTEROL SULFATE (2.5 MG/3ML) 0.083% IN NEBU: 3.0000 mL | INHALATION_SOLUTION | Freq: Four times a day (QID) | RESPIRATORY_TRACT | Status: DC | PRN

## 2024-08-15 MED ORDER — SODIUM CHLORIDE (PF) 0.9 % IJ SOLN
INTRAMUSCULAR | Status: AC
  Filled 2024-08-15: qty 40

## 2024-08-15 MED ORDER — LIDOCAINE HCL (PF) 2 % IJ SOLN
INTRAMUSCULAR | Status: AC
  Filled 2024-08-15: qty 5

## 2024-08-15 MED ORDER — CHLORHEXIDINE GLUCONATE 0.12 % MT SOLN
15.0000 mL | Freq: Once | OROMUCOSAL | Status: AC
  Administered 2024-08-15: 15 mL via OROMUCOSAL

## 2024-08-15 MED ORDER — DIPHENHYDRAMINE HCL 50 MG/ML IJ SOLN
INTRAMUSCULAR | Status: AC
  Filled 2024-08-15: qty 1

## 2024-08-15 MED ORDER — DEXMEDETOMIDINE HCL IN NACL 80 MCG/20ML IV SOLN
INTRAVENOUS | Status: AC
  Filled 2024-08-15: qty 20

## 2024-08-15 MED ORDER — CHLORHEXIDINE GLUCONATE 0.12 % MT SOLN
OROMUCOSAL | Status: AC
  Filled 2024-08-15: qty 15

## 2024-08-15 MED ORDER — ORAL CARE MOUTH RINSE: 15.0000 mL | Freq: Once | OROMUCOSAL | Status: AC

## 2024-08-15 MED ORDER — ONDANSETRON HCL 4 MG PO TABS
4.0000 mg | ORAL_TABLET | Freq: Four times a day (QID) | ORAL | 0 refills | Status: AC | PRN
  Filled 2024-08-15: qty 20, 5d supply, fill #0

## 2024-08-15 MED ORDER — HYDROCHLOROTHIAZIDE 12.5 MG PO TABS
6.2500 mg | ORAL_TABLET | Freq: Every day | ORAL | Status: AC
Start: 2024-08-15 — End: ?
  Filled 2024-08-15 (×3): qty 1

## 2024-08-15 MED ORDER — PROPOFOL 500 MG/50ML IV EMUL
INTRAVENOUS | Status: DC | PRN
  Administered 2024-08-15: 125 ug/kg/min via INTRAVENOUS

## 2024-08-15 MED ORDER — BUPIVACAINE HCL (PF) 0.5 % IJ SOLN
INTRAMUSCULAR | Status: DC | PRN
  Administered 2024-08-15: 3 mL via INTRATHECAL

## 2024-08-15 MED ORDER — CEFAZOLIN SODIUM-DEXTROSE 2-4 GM/100ML-% IV SOLN
2.0000 g | Freq: Four times a day (QID) | INTRAVENOUS | Status: AC
  Administered 2024-08-15 (×2): 2 g via INTRAVENOUS
  Filled 2024-08-15 (×2): qty 100

## 2024-08-15 MED ORDER — ONDANSETRON HCL 4 MG/2ML IJ SOLN
INTRAMUSCULAR | Status: DC | PRN
  Administered 2024-08-15: 4 mg via INTRAVENOUS

## 2024-08-15 MED ORDER — KETOROLAC TROMETHAMINE 30 MG/ML IJ SOLN
INTRAMUSCULAR | Status: AC
  Filled 2024-08-15: qty 1

## 2024-08-15 MED ORDER — CEFAZOLIN SODIUM-DEXTROSE 2-4 GM/100ML-% IV SOLN
INTRAVENOUS | Status: AC
  Filled 2024-08-15: qty 100

## 2024-08-15 MED ORDER — MIDAZOLAM HCL 5 MG/5ML IJ SOLN
INTRAMUSCULAR | Status: DC | PRN
  Administered 2024-08-15 (×2): 1 mg via INTRAVENOUS

## 2024-08-15 MED ORDER — DOCUSATE SODIUM 100 MG PO CAPS
100.0000 mg | ORAL_CAPSULE | Freq: Two times a day (BID) | ORAL | Status: DC
  Filled 2024-08-15 (×2): qty 1

## 2024-08-15 MED ORDER — DIPHENHYDRAMINE HCL 50 MG/ML IJ SOLN
INTRAMUSCULAR | Status: DC | PRN
  Administered 2024-08-15: 12.5 mg via INTRAVENOUS

## 2024-08-15 MED ORDER — OXYCODONE HCL 5 MG PO TABS: 5.0000 mg | ORAL_TABLET | Freq: Once | ORAL | Status: DC | PRN

## 2024-08-15 MED ORDER — DEXAMETHASONE SOD PHOSPHATE PF 10 MG/ML IJ SOLN
INTRAMUSCULAR | Status: DC | PRN
  Administered 2024-08-15: 10 mg via INTRAVENOUS

## 2024-08-15 MED ORDER — ACETAMINOPHEN 500 MG PO TABS
1000.0000 mg | ORAL_TABLET | Freq: Three times a day (TID) | ORAL | Status: DC
  Administered 2024-08-15 – 2024-08-16 (×2): 1000 mg via ORAL
  Filled 2024-08-15 (×3): qty 2

## 2024-08-15 MED ORDER — CEFAZOLIN SODIUM-DEXTROSE 2-4 GM/100ML-% IV SOLN
2.0000 g | INTRAVENOUS | Status: AC
Start: 2024-08-15 — End: 2024-08-15
  Administered 2024-08-15: 2 g via INTRAVENOUS

## 2024-08-15 MED ORDER — MIDAZOLAM HCL 2 MG/2ML IJ SOLN
INTRAMUSCULAR | Status: AC
  Filled 2024-08-15: qty 2

## 2024-08-15 MED ORDER — APIXABAN 2.5 MG PO TABS
2.5000 mg | ORAL_TABLET | Freq: Two times a day (BID) | ORAL | Status: DC
  Administered 2024-08-16: 2.5 mg via ORAL
  Filled 2024-08-15: qty 1

## 2024-08-15 MED ORDER — LACTATED RINGERS IV SOLN
INTRAVENOUS | Status: DC
Start: 2024-08-15 — End: 2024-08-15

## 2024-08-15 MED ORDER — FENTANYL CITRATE (PF) 100 MCG/2ML IJ SOLN: 25.0000 ug | INTRAMUSCULAR | Status: DC | PRN

## 2024-08-15 MED ORDER — MORPHINE SULFATE (PF) 4 MG/ML IV SOLN: 0.5000 mg | INTRAVENOUS | Status: DC | PRN

## 2024-08-15 MED ORDER — TRAMADOL HCL 50 MG PO TABS
50.0000 mg | ORAL_TABLET | Freq: Four times a day (QID) | ORAL | Status: DC | PRN
  Administered 2024-08-16 (×2): 50 mg via ORAL
  Filled 2024-08-15 (×2): qty 1

## 2024-08-15 MED ORDER — TRANEXAMIC ACID-NACL 1000-0.7 MG/100ML-% IV SOLN
1000.0000 mg | INTRAVENOUS | Status: AC
Start: 2024-08-15 — End: 2024-08-15
  Administered 2024-08-15 (×2): 1000 mg via INTRAVENOUS

## 2024-08-15 MED ORDER — HYDROCODONE-ACETAMINOPHEN 5-325 MG PO TABS
1.0000 | ORAL_TABLET | ORAL | Status: DC | PRN
  Administered 2024-08-15: 1 via ORAL
  Filled 2024-08-15: qty 1

## 2024-08-15 MED ORDER — CEFAZOLIN SODIUM-DEXTROSE 2-4 GM/100ML-% IV SOLN: 2.0000 g | Freq: Four times a day (QID) | INTRAVENOUS | Status: DC

## 2024-08-15 MED ORDER — KETOROLAC TROMETHAMINE 15 MG/ML IJ SOLN
7.5000 mg | Freq: Four times a day (QID) | INTRAMUSCULAR | Status: AC
  Administered 2024-08-15 – 2024-08-16 (×4): 7.5 mg via INTRAVENOUS
  Filled 2024-08-15 (×3): qty 1

## 2024-08-15 MED ORDER — METOCLOPRAMIDE HCL 5 MG PO TABS: 5.0000 mg | ORAL_TABLET | Freq: Three times a day (TID) | ORAL | Status: DC | PRN

## 2024-08-15 MED ORDER — APIXABAN 2.5 MG PO TABS
2.5000 mg | ORAL_TABLET | Freq: Two times a day (BID) | ORAL | 0 refills | Status: AC
  Filled 2024-08-15: qty 56, 28d supply, fill #0

## 2024-08-15 MED ORDER — BISOPROLOL-HYDROCHLOROTHIAZIDE 5-6.25 MG PO TABS
1.0000 | ORAL_TABLET | Freq: Every day | ORAL | Status: DC
  Filled 2024-08-15: qty 1

## 2024-08-15 MED ORDER — METOCLOPRAMIDE HCL 5 MG/ML IJ SOLN: 5.0000 mg | Freq: Three times a day (TID) | INTRAMUSCULAR | Status: DC | PRN

## 2024-08-15 MED ORDER — OXYCODONE HCL 5 MG PO TABS
2.5000 mg | ORAL_TABLET | Freq: Three times a day (TID) | ORAL | 0 refills | Status: AC | PRN
  Filled 2024-08-15: qty 20, 7d supply, fill #0

## 2024-08-15 MED ORDER — PANTOPRAZOLE SODIUM 40 MG PO TBEC
40.0000 mg | DELAYED_RELEASE_TABLET | Freq: Every day | ORAL | Status: DC
  Filled 2024-08-15: qty 1

## 2024-08-15 MED ORDER — ACETAMINOPHEN 10 MG/ML IV SOLN
INTRAVENOUS | Status: DC | PRN
  Administered 2024-08-15: 1000 mg via INTRAVENOUS

## 2024-08-15 MED ORDER — FENTANYL CITRATE (PF) 100 MCG/2ML IJ SOLN
INTRAMUSCULAR | Status: AC
  Filled 2024-08-15: qty 2

## 2024-08-15 MED ORDER — PROPOFOL 1000 MG/100ML IV EMUL
INTRAVENOUS | Status: AC
  Filled 2024-08-15: qty 100

## 2024-08-15 MED ORDER — SODIUM CHLORIDE 0.9 % IR SOLN
Status: DC | PRN
  Administered 2024-08-15: 3000 mL

## 2024-08-15 MED ORDER — SODIUM CHLORIDE 0.9 % IV SOLN: INTRAVENOUS | Status: DC

## 2024-08-15 MED ORDER — ONDANSETRON HCL 4 MG PO TABS: 4.0000 mg | ORAL_TABLET | Freq: Four times a day (QID) | ORAL | Status: DC | PRN

## 2024-08-15 MED ORDER — PHENYLEPHRINE 80 MCG/ML (10ML) SYRINGE FOR IV PUSH (FOR BLOOD PRESSURE SUPPORT)
PREFILLED_SYRINGE | INTRAVENOUS | Status: AC
  Filled 2024-08-15: qty 10

## 2024-08-15 MED ORDER — TRANEXAMIC ACID-NACL 1000-0.7 MG/100ML-% IV SOLN
INTRAVENOUS | Status: AC
  Filled 2024-08-15: qty 100

## 2024-08-15 MED ORDER — BISOPROLOL FUMARATE 5 MG PO TABS
5.0000 mg | ORAL_TABLET | Freq: Every day | ORAL | Status: DC
  Filled 2024-08-15 (×2): qty 1

## 2024-08-15 MED ORDER — ACETAMINOPHEN 500 MG PO TABS
1000.0000 mg | ORAL_TABLET | Freq: Three times a day (TID) | ORAL | 0 refills | Status: AC
  Filled 2024-08-15: qty 30, 5d supply, fill #0

## 2024-08-15 MED ORDER — DOCUSATE SODIUM 100 MG PO CAPS
100.0000 mg | ORAL_CAPSULE | Freq: Two times a day (BID) | ORAL | 0 refills | Status: AC
  Filled 2024-08-15: qty 10, 5d supply, fill #0

## 2024-08-15 MED ORDER — BUPIVACAINE LIPOSOME 1.3 % IJ SUSP
INTRAMUSCULAR | Status: AC
  Filled 2024-08-15: qty 40

## 2024-08-15 MED ORDER — SURGIPHOR WOUND IRRIGATION SYSTEM - OPTIME: TOPICAL | Status: DC | PRN

## 2024-08-15 MED ORDER — PROPOFOL 10 MG/ML IV BOLUS
INTRAVENOUS | Status: AC
  Filled 2024-08-15: qty 20

## 2024-08-15 MED ORDER — PHENOL 1.4 % MT LIQD: 1.0000 | OROMUCOSAL | Status: DC | PRN

## 2024-08-15 MED ORDER — ONDANSETRON HCL 4 MG/2ML IJ SOLN
INTRAMUSCULAR | Status: AC
  Filled 2024-08-15: qty 2

## 2024-08-15 MED ORDER — KETOROLAC TROMETHAMINE 15 MG/ML IJ SOLN
INTRAMUSCULAR | Status: AC
  Filled 2024-08-15: qty 1

## 2024-08-15 MED ORDER — ONDANSETRON HCL 4 MG/2ML IJ SOLN: 4.0000 mg | Freq: Four times a day (QID) | INTRAMUSCULAR | Status: DC | PRN

## 2024-08-15 MED ORDER — BUPIVACAINE HCL (PF) 0.5 % IJ SOLN
INTRAMUSCULAR | Status: AC
  Filled 2024-08-15: qty 10

## 2024-08-15 MED ORDER — TRAMADOL HCL 50 MG PO TABS
50.0000 mg | ORAL_TABLET | Freq: Four times a day (QID) | ORAL | 0 refills | Status: AC | PRN
  Filled 2024-08-15: qty 30, 8d supply, fill #0

## 2024-08-15 MED ORDER — DROPERIDOL 2.5 MG/ML IJ SOLN: 0.6250 mg | Freq: Once | INTRAMUSCULAR | Status: DC | PRN

## 2024-08-15 MED ORDER — LIDOCAINE HCL (PF) 1 % IJ SOLN
INTRAMUSCULAR | Status: DC | PRN
  Administered 2024-08-15: 3 mL

## 2024-08-15 MED ORDER — MENTHOL 3 MG MT LOZG: 1.0000 | LOZENGE | OROMUCOSAL | Status: DC | PRN

## 2024-08-15 MED ORDER — LIDOCAINE HCL (CARDIAC) PF 100 MG/5ML IV SOSY
PREFILLED_SYRINGE | INTRAVENOUS | Status: DC | PRN
  Administered 2024-08-15: 60 mg via INTRAVENOUS

## 2024-08-15 MED ORDER — PHENYLEPHRINE HCL-NACL 20-0.9 MG/250ML-% IV SOLN: INTRAVENOUS | Status: DC | PRN

## 2024-08-15 SURGICAL SUPPLY — 60 items
BLADE PATELLA REAM PILOT HOLE (MISCELLANEOUS) IMPLANT
BLADE SAW 90X13X1.19 OSCILLAT (BLADE) IMPLANT
BLADE SAW SAG 25X90X1.19 (BLADE) ×1 IMPLANT
BLADE SAW SAG 29X58X.64 (BLADE) ×1 IMPLANT
BNDG ELASTIC 6INX 5YD STR LF (GAUZE/BANDAGES/DRESSINGS) ×1 IMPLANT
BOWL CEMENT MIX W/ADAPTER (MISCELLANEOUS) IMPLANT
BRUSH SCRUB EZ PLAIN DRY (MISCELLANEOUS) IMPLANT
CEMENT BONE R 1X40 (Cement) IMPLANT
CHLORAPREP W/TINT 26 (MISCELLANEOUS) ×2 IMPLANT
COMPONENT FEM CMT PRSN STD SZ7 (Knees) IMPLANT
COOLER ICEMAN CLASSIC (MISCELLANEOUS) ×1 IMPLANT
CUFF TRNQT CYL 24X4X16.5-23 (TOURNIQUET CUFF) IMPLANT
CUFF TRNQT CYL 30X4X21-28X (TOURNIQUET CUFF) IMPLANT
DERMABOND ADVANCED .7 DNX12 (GAUZE/BANDAGES/DRESSINGS) ×1 IMPLANT
DRAPE SHEET LG 3/4 BI-LAMINATE (DRAPES) ×2 IMPLANT
DRSG MEPILEX SACRM 8.7X9.8 (GAUZE/BANDAGES/DRESSINGS) ×1 IMPLANT
DRSG OPSITE POSTOP 4X10 (GAUZE/BANDAGES/DRESSINGS) IMPLANT
DRSG OPSITE POSTOP 4X8 (GAUZE/BANDAGES/DRESSINGS) IMPLANT
ELECTRODE REM PT RTRN 9FT ADLT (ELECTROSURGICAL) ×1 IMPLANT
GLOVE BIO SURGEON STRL SZ8 (GLOVE) ×1 IMPLANT
GLOVE BIOGEL PI IND STRL 8 (GLOVE) ×1 IMPLANT
GLOVE PI ORTHO PRO STRL 7.5 (GLOVE) ×2 IMPLANT
GLOVE PI ORTHO PRO STRL SZ8 (GLOVE) ×2 IMPLANT
GLOVE SURG SYN 7.5 PF PI (GLOVE) ×1 IMPLANT
GOWN SRG XL LONG LVL 3 NONREIN (GOWNS) ×1 IMPLANT
GOWN SRG XL LVL 3 NONREINFORCE (GOWNS) ×1 IMPLANT
GOWN STRL REUS W/ TWL LRG LVL3 (GOWN DISPOSABLE) ×1 IMPLANT
HOOD PEEL AWAY T7 (MISCELLANEOUS) ×2 IMPLANT
INSERT FIXED AS SZ 6-7 13 LT (Insert) IMPLANT
KIT TURNOVER KIT A (KITS) ×1 IMPLANT
MANIFOLD NEPTUNE II (INSTRUMENTS) ×1 IMPLANT
MARKER SKIN DUAL TIP RULER LAB (MISCELLANEOUS) ×1 IMPLANT
MAT ABSORB FLUID 56X50 GRAY (MISCELLANEOUS) ×1 IMPLANT
NDL HYPO 21X1.5 SAFETY (NEEDLE) ×1 IMPLANT
NEEDLE HYPO 21X1.5 SAFETY (NEEDLE) ×1 IMPLANT
PACK TOTAL KNEE (MISCELLANEOUS) ×1 IMPLANT
PAD ARMBOARD POSITIONER FOAM (MISCELLANEOUS) ×3 IMPLANT
PAD COLD UNI WRAP-ON (PAD) ×1 IMPLANT
PENCIL SMOKE EVACUATOR (MISCELLANEOUS) ×1 IMPLANT
PIN DRILL HDLS TROCAR 75 4PK (PIN) IMPLANT
SCREW FEMALE HEX FIX 25X2.5 (ORTHOPEDIC DISPOSABLE SUPPLIES) IMPLANT
SCREW HEX HEADED 3.5X27 DISP (ORTHOPEDIC DISPOSABLE SUPPLIES) IMPLANT
SLEEVE SCD COMPRESS KNEE MED (STOCKING) ×1 IMPLANT
SOL .9 NS 3000ML IRR UROMATIC (IV SOLUTION) ×1 IMPLANT
SOLN STERILE WATER 1000 ML (IV SOLUTION) ×1 IMPLANT
SOLN STERILE WATER BTL 1000 ML (IV SOLUTION) ×1 IMPLANT
SOLUTION IRRIG SURGIPHOR (IV SOLUTION) ×1 IMPLANT
STEM POLY PAT PLY 32M KNEE (Knees) IMPLANT
STEM TIB ST PERS 14+30 (Stem) IMPLANT
STEM TIBIA 5 DEG SZ E L KNEE (Knees) IMPLANT
STOCKINETTE IMPERV 14X48 (MISCELLANEOUS) ×1 IMPLANT
SUT STRATAFIX 14 PDO 36 VLT (SUTURE) ×1 IMPLANT
SUT VIC AB 0 CT1 36 (SUTURE) ×1 IMPLANT
SUT VIC AB 2-0 CT2 27 (SUTURE) ×2 IMPLANT
SUTURE STRATA SPIR 4-0 18 (SUTURE) ×1 IMPLANT
SUTURE VICRYL 1-0 27IN ABS (SUTURE) ×1 IMPLANT
SYR 20ML LL LF (SYRINGE) ×2 IMPLANT
TAPE CLOTH 3X10 WHT NS LF (GAUZE/BANDAGES/DRESSINGS) ×1 IMPLANT
TIP FAN IRRIG PULSAVAC PLUS (DISPOSABLE) ×1 IMPLANT
TRAP FLUID SMOKE EVACUATOR (MISCELLANEOUS) ×1 IMPLANT

## 2024-08-15 NOTE — Discharge Summary (Signed)
 Physician Discharge Summary  Patient ID: Sarah Harrison MRN: [id number] DOB/AGE: [birth year] 78 y.o.  Admit date: 08/15/2024 Discharge date: 08/16/2024  Admission Diagnoses:  Osteoarthritis of left knee, unspecified osteoarthritis type [M17.12] S/P TKR (total knee replacement), left [Z96.652]   Discharge Diagnoses: Patient Active Problem List   Diagnosis Date Noted   S/P TKR (total knee replacement), left 08/15/2024   History of colonic polyps    Benign neoplasm of cecum    Asthma without status asthmaticus 12/03/2018   Cystitis 12/03/2018   Depression 12/03/2018   Elevated blood pressure 12/03/2018   Fibromyalgia 12/03/2018   GERD (gastroesophageal reflux disease) 12/03/2018   Hyperlipidemia 12/03/2018   Seasonal allergic rhinitis 12/03/2018   Hypertension 09/28/2017   Vitamin D deficiency, unspecified 06/12/2017   Right ovarian cyst 04/11/2016    Past Medical History:  Diagnosis Date   Alpha-gal syndrome    Anxiety    Aortic atherosclerosis    Arthritis    Asthma    Baker's cyst of knee, left 03/23/2023   CAD (coronary artery disease)    Chronic gastritis    Colon polyps    Complication of anesthesia    a.) reaction following colonoscopy that is described as feelings of impending doom   COPD (chronic obstructive pulmonary disease) (HCC)    Depression    Family history of adverse reaction to anesthesia    a.) PONV in 1st degree female relative (sister)   Fibromyalgia    GERD (gastroesophageal reflux disease)    Hemangioma of liver    Hepatic steatosis    HLD (hyperlipidemia)    Hypertension    Incomplete RBBB    OSA on CPAP    Palpitations    Post-dural puncture headache following Cesarean    Pre-diabetes    Right ovarian cyst    Seasonal allergic rhinitis    Sinus bradycardia    Vitamin D deficiency      Transfusion: bibe   Consultants (if any):   Discharged Condition: Improved  Hospital Course: Sarah Harrison is an 78 y.o. female who  was admitted 08/15/2024 with a diagnosis of S/P TKR (total knee replacement), left and went to the operating room on 08/15/2024 and underwent the above named procedures.    Surgeries: Procedure(s): ARTHROPLASTY, KNEE, TOTAL on 08/15/2024 Patient tolerated the surgery well. Taken to PACU where she was stabilized and then transferred to the orthopedic floor.  Started on Eliquis,TEDs and SCDs applied bilaterally. Heels elevated on bed. No evidence of DVT. Negative Homan. Physical therapy started on day #1 for gait training and transfer. OT started day #1 for ADL and assisted devices.  Patient's IV was d/c on day #1. Patient was able to safely and independently complete all PT goals. PT recommending discharge to home.    On post op day #1 patient was stable and ready for discharge to home with HHPT.  Implants: Femur: Persona Size 7 CR   Tibia: Persona Size E w/ 14x64mm stem extension  Poly: 13mm MC  Patella: 32x8.53mm symmetric   She was given perioperative antibiotics:  Anti-infectives (From admission, onward)    Start     Dose/Rate Route Frequency Ordered Stop   08/15/24 1330  ceFAZolin (ANCEF) IVPB 2g/100 mL premix  Status:  Discontinued        2 g 200 mL/hr over 30 Minutes Intravenous Every 6 hours 08/15/24 1107 08/15/24 1111   08/15/24 1330  ceFAZolin (ANCEF) IVPB 2g/100 mL premix        2 g 200  mL/hr over 30 Minutes Intravenous Every 6 hours 08/15/24 1111 08/15/24 2025   08/15/24 0615  ceFAZolin (ANCEF) IVPB 2g/100 mL premix        2 g 200 mL/hr over 30 Minutes Intravenous On call to O.R. 08/15/24 9389 08/15/24 0741     .  She was given sequential compression devices, early ambulation, and Eliquis,TEDs for DVT prophylaxis.  She benefited maximally from the hospital stay and there were no complications.    Recent vital signs:  Vitals:   08/16/24 0037 08/16/24 0350  BP: (!) 135/59 136/61  Pulse: 65 61  Resp: 13 12  Temp: 98.2 F (36.8 C) (!) 97.3 F (36.3 C)  SpO2: 97% 97%     Recent laboratory studies:  Lab Results  Component Value Date   HGB 12.1 08/16/2024   HGB 14.1 08/08/2024   Lab Results  Component Value Date   WBC 13.9 (H) 08/16/2024   PLT 210 08/16/2024   No results found for: INR Lab Results  Component Value Date   NA 140 08/16/2024   K 4.2 08/16/2024   CL 108 08/16/2024   CO2 27 08/16/2024   BUN 14 08/16/2024   CREATININE 0.65 08/16/2024   GLUCOSE 128 (H) 08/16/2024    Discharge Medications:   Allergies as of 08/16/2024       Reactions   Alpha-gal Anaphylaxis   Bovine (beef) Protein-containing Drug Products Anaphylaxis   Penicillins Anaphylaxis   Porcine (pork) Protein-containing Drug Products Anaphylaxis   Bee Venom Swelling   Elemental Sulfur    Muscle cramps   Sudafed [pseudoephedrine Hcl]    Dizzy and disoriented        Medication List     TAKE these medications    Acetaminophen Extra Strength 500 MG Tabs Take 2 tablets (1,000 mg total) by mouth every 8 (eight) hours.   albuterol 108 (90 Base) MCG/ACT inhaler Commonly known as: VENTOLIN HFA Inhale 2 puffs into the lungs every 6 (six) hours as needed for wheezing or shortness of breath.   bisoprolol-hydrochlorothiazide 5-6.25 MG tablet Commonly known as: ZIAC Take 1 tablet by mouth daily.   calcium carbonate 750 MG chewable tablet Commonly known as: TUMS EX Chew 1 tablet by mouth daily as needed for heartburn.   cetirizine 10 MG tablet Commonly known as: ZYRTEC Take 10 mg by mouth daily as needed for allergies.   Cholecalciferol 50 MCG (2000 UT) Tabs Take 2,000 Units by mouth daily.   diphenhydrAMINE 25 MG tablet Commonly known as: BENADRYL Take 25 mg by mouth every 6 (six) hours as needed for allergies.   docusate sodium 100 MG capsule Commonly known as: COLACE Take 1 capsule (100 mg total) by mouth 2 (two) times daily.   Eliquis 2.5 MG Tabs tablet Generic drug: apixaban Take 1 tablet (2.5 mg total) by mouth every 12 (twelve) hours for 28  days.   EPINEPHrine 0.3 mg/0.3 mL Soaj injection Commonly known as: EPI-PEN Inject 0.3 mg into the muscle as needed for anaphylaxis.   NON FORMULARY Pt uses a cpap nightly   ondansetron  4 MG tablet Commonly known as: ZOFRAN  Take 1 tablet (4 mg total) by mouth every 6 (six) hours as needed for nausea.   oxyCODONE 5 MG immediate release tablet Commonly known as: Roxicodone Take 0.5-1 tablets (2.5-5 mg total) by mouth every 8 (eight) hours as needed.   pantoprazole 40 MG tablet Commonly known as: PROTONIX Take 40 mg by mouth daily.   rosuvastatin 5 MG tablet Commonly known as:  CRESTOR Take 5 mg by mouth 3 (three) times a week.   Systane Ultra PF 0.4-0.3 % Soln Generic drug: Polyethyl Glyc-Propyl Glyc PF Place 1 drop into both eyes 2 (two) times daily as needed (dry eyes).   traMADol 50 MG tablet Commonly known as: ULTRAM Take 1 tablet (50 mg total) by mouth every 6 (six) hours as needed for moderate pain (pain score 4-6).   Voltaren 1 % Gel Generic drug: diclofenac Sodium Apply 2 g topically daily as needed (knee pain).               Durable Medical Equipment  (From admission, onward)           Start     Ordered   08/15/24 1347  For home use only DME Walker  Once       Question:  Patient needs a walker to treat with the following condition  Answer:  Total knee replacement status   08/15/24 1346   08/15/24 1347  For home use only DME 3 n 1  Once        08/15/24 1346            Diagnostic Studies: DG Knee Left Port Result Date: 08/15/2024 CLINICAL DATA:  Status post total knee replacement EXAM: PORTABLE LEFT KNEE - 1-2 VIEW COMPARISON:  None Available. FINDINGS: Two views study shows tricompartmental knee replacement. No evidence for immediate hardware complication. Probable small joint effusion. IMPRESSION: Status post tricompartmental knee replacement without evidence for immediate hardware complication. Electronically Signed   By: Camellia Candle M.D.    On: 08/15/2024 10:43    Disposition: Discharge disposition: 06-Home-Health Care Svc          Follow-up Information     Charlene Debby BROCKS, PA-C Follow up in 2 week(s).   Specialties: Orthopedic Surgery, Emergency Medicine Contact information: [address] [place] [address] [phone]                  Signed: Debby BROCKS Charlene 08/16/2024, 7:56 AM

## 2024-08-15 NOTE — H&P (Signed)
 History of Present Illness: The patient is an 78 y.o. female seen in clinic today for history and physical for left total knee arthroplasty with Dr. Lorelle on 08/15/2024. Patient has had years of increasing left knee pain. X-rays show complete loss of joint space in the medial compartment of the left knee. She has had short-term relief with injections. Despite injections and other forms of conservative treatment consisting of medications her pain is 8 out of 10 and interferes with her quality of life and activities daily living. She has been unable to exercise and has felt declining health due to inability to exercise. Her pain interferes with her quality of life and activities daily living. She is seen Dr. Lorelle discussed total knee arthroplasty and agreed and consented the procedure.  Patient is a non-smoker nondiabetic with a BMI of 27.6. Patient has a history of alpha gal  Past Medical History: Past Medical History:  Diagnosis Date  Allergic state  Anxiety  Arthritis  Asthma without status asthmaticus (HHS-HCC)  Complication of anesthesia  COPD (chronic obstructive pulmonary disease) (CMS/HHS-HCC)  Cystitis  Depression  Elevated blood pressure  Fibromyalgia  GERD (gastroesophageal reflux disease)  Hyperlipidemia  Hypertension  Liver hemangioma  OSA on CPAP 01/10/2022  Seasonal allergic rhinitis   Past Surgical History: Past Surgical History:  Procedure Laterality Date  BREAST BIOPSY Right 1986  COLONOSCOPY 07/28/2004  FH Colon Polyps (Father/Mother)  COLONOSCOPY 03/18/2005  FH Colon Polyps (Father/Mother)  EGD 03/18/2005  No repeat per RTE  COLONOSCOPY 06/04/2010  FH Colon Polyps (Father/Mother): [facility]BF 06/2015; OV 04/02/2015 w/Kim Moishe NP (dw)  EGD 06/04/2010  COLONOSCOPY 06/15/2015  Adenomatous Polyps, FH Colon Polyps (Father/Mother): [facility]BF 05/2020  COLONOSCOPY 12/30/2019  Dr. Jinny  EGD 02/11/2022  Focal Intestinal metaplasia/Repeat 4yr/SMR  EGD @ [facility] 06/22/2023  Fundic  gland polyps/Gastritis/No repeat/SMR  ABDOMINAL SURGERY  Breast biopsies right breast  benign  CESAREAN SECTION  CHOLECYSTECTOMY  DILATION AND CURETTAGE OF UTERUS  Neuroma Excision  Right hand surgery  due to trauma   Past Family History: Family History  Problem Relation Age of Onset  Heart disease Mother  Hyperlipidemia (Elevated cholesterol) Mother  Osteoarthritis Mother  Osteoporosis (Thinning of bones) Mother  Anxiety Mother  Coronary Artery Disease (Blocked arteries around heart) Mother  Depression Mother  High blood pressure (Hypertension) Mother  Diabetes Father  Heart disease Father  High blood pressure (Hypertension) Father  Melanoma Father  Coronary Artery Disease (Blocked arteries around heart) Father  Depression Father  Diabetes type II Father  Hyperlipidemia (Elevated cholesterol) Father  Skin cancer Father  Ovarian cancer Maternal Grandmother  Kidney cancer Other  Colon polyps Other  Coronary Artery Disease (Blocked arteries around heart) Other  High blood pressure (Hypertension) Other  Diabetes type II Other  Breast cancer Other  Colon cancer Neg Hx   Medications: Current Outpatient Medications  Medication Sig Dispense Refill  albuterol 90 mcg/actuation inhaler INHALE 2 INHALATIONS BY MOUTH INTO THE LUNGS EVERY 4 HOURS AS NEEDED FOR WHEEZING 18 g 5  bisoproloL-hydroCHLOROthiazide (ZIAC) 5-6.25 mg tablet TAKE 1 TABLET BY MOUTH DAILY 90 tablet 1  blood glucose diagnostic test strip Use 1 each (1 strip total) 2 (two) times daily Use as instructed. 100 each 3  calcium carbonate 300 mg (750 mg) Chew 2 tab by mouth as needed  diphenhydrAMINE (BENADRYL) 25 mg capsule Take 25 mg by mouth every 6 (six) hours as needed for Itching  EPINEPHrine (EPIPEN) 0.3 mg/0.3 mL auto-injector Inject 0.3 mg into the muscle once as needed  ergocalciferol, vitamin D2, (VITAMIN D2) 1,250 mcg (50,000 unit) capsule Take 1 capsule (50,000 Units total) by mouth once a week 12 capsule  3  levalbuterol (XOPENEX HFA) inhaler Inhale 2 inhalations into the lungs every 4 (four) hours as needed for Wheezing or Shortness of Breath 15 g 0  pantoprazole (PROTONIX) 40 MG DR tablet Take 1 tablet (40 mg total) by mouth once daily 60 tablet 3  rosuvastatin (CRESTOR) 5 MG tablet Take 1 tablet (5 mg total) by mouth once daily 90 tablet 3   Current Facility-Administered Medications  Medication Dose Route Frequency Provider Last Rate Last Admin  cyanocobalamin (VITAMIN B12) injection 1,000 mcg 1,000 mcg Intramuscular Q30 Days Auston Reyes BIRCH, MD 1,000 mcg at 08/02/24 1143   Allergies: Allergies  Allergen Reactions  Penicillins Other (See Comments)  Other Reaction: hives and wheezing  Sudafed [Pseudoephedrine Hcl] Other (See Comments)  Other Reaction: palpitations and dizziness  Alpha-Gal (Galactose-Alpha-1,3-Galactose) Unknown  Insect Venom Swelling  Sulfa (Sulfonamide Antibiotics) Other (See Comments)  Leg cramps  Venom-Honey Bee Swelling    Visit Vitals: Vitals:  08/06/24 0843  BP: 122/62    Review of Systems:  A comprehensive 14 point ROS was performed, reviewed, and the pertinent orthopaedic findings are documented in the HPI.  Physical Exam: General:  Well developed, well nourished, no apparent distress, normal affect, normal gait with no antalgic component.   HEENT: Head normocephalic, atraumatic, PERRL.   Abdomen: Soft, non tender, non distended, Bowel sounds present.  Heart: Examination of the heart reveals regular, rate, and rhythm. There is no murmur noted on ascultation. There is a normal apical pulse.  Lungs: Lungs are clear to auscultation. There is no wheeze, rhonchi, or crackles. There is normal expansion of bilateral chest walls.   Comprehensive Knee Exam: Gait Antalgic on the left  Alignment Neutral   Inspection Right Left  Skin Normal appearance with no obvious deformity. No ecchymosis or erythema. Normal appearance with no obvious  deformity. No ecchymosis or erythema.  Soft Tissue No focal soft tissue swelling No focal soft tissue swelling  Quad Atrophy None None   Palpation  Right Left  Tenderness Mild medial joint line tenderness to palpation medial joint line tenderness to palpation which reproduces pain  Crepitus No patellofemoral or tibiofemoral crepitus + patellofemoral crepitus  Effusion None None   Range of Motion Right Left  Flexion 0-120 0-120  Extension Full knee extension without hyperextension Full knee extension without hyperextension   Ligamentous Exam Right Left  Lachman Normal Normal  Valgus 0 Normal Normal  Valgus 30 Normal Normal  Varus 0 Normal Normal  Varus 30 Normal Normal  Anterior Drawer Normal Normal  Posterior Drawer Normal Normal   Meniscal Exam Right Left  Hyperflexion Test Negative Positive  Hyperextension Test Negative Negative  McMurray's Negative Positive   Neurovascular Right Left  Quadriceps Strength 5/5 5/5  Hamstring Strength 5/5 5/5  Hip Abductor Strength 4/5 4/5  Distal Motor Normal Normal  Distal Sensory Normal light touch sensation Normal light touch sensation  Distal Pulses Normal Normal    Imaging Studies: X-rays of her left knee reviewed by me today show moderate to severe degenerative changes with tricompartmental joint space narrowing and medial bone-on-bone articulation with lateral subluxation of the tibia relative to the femur. There is osteophyte formation and sclerosis and early subchondral cyst formation. Kellgren-Lawrence grade 3/4. AP, sunrise, and flexed PA of the right knee also show moderate degenerative changes with medial joint space narrowing with osteophyte formation and sclerosis as  well as patellofemoral degeneration. Kellgren-Lawrence grade 3. no fractures or dislocations noted in either knee.   Assessment:  ICD-10-CM  1. Primary osteoarthritis of left knee M17.12    Plan: Tanja is a 78 year old female presents with advanced left  knee osteoarthritis. Patient has complete loss of joint space in the medial compartment of the left knee with tricompartmental arthritic changes. She has had years of progressing pain with diminishing results with cortisone injections. Despite conservative treatment consisting of injections and medications her pain interferes with her quality of life and activities daily living. Risks, benefits, complications of left total knee arthroplasty have been discussed with the patient. Patient has agreed and consented procedure with Dr. Lorelle on 08/15/2024.  The hospitalization and post-operative care and rehabilitation were also discussed. The use of perioperative antibiotics and DVT prophylaxis were discussed. The risk, benefits and alternatives to a surgical intervention were discussed at length with the patient. The patient was also advised of risks related to the medical comorbidities and elevated body mass index (BMI). A lengthy discussion took place to review the most common complications including but not limited to: stiffness, loss of function, complex regional pain syndrome, deep vein thrombosis, pulmonary embolus, heart attack, stroke, infection, wound breakdown, numbness, intraoperative fracture, damage to nerves, tendon,muscles, arteries or other blood vessels, death and other possible complications from anesthesia. The patient was told that we will take steps to minimize these risks by using sterile technique, antibiotics and DVT prophylaxis when appropriate and follow the patient postoperatively in the office setting to monitor progress. The possibility of recurrent pain, no improvement in pain and actual worsening of pain were also discussed with the patient.   All questions answered patient agrees with above plan for left total knee arthroplasty.

## 2024-08-15 NOTE — Op Note (Signed)
 Patient Name: Sarah Harrison  FMW:[id number]  Pre-Operative Diagnosis: Left knee Osteoarthritis  Post-Operative Diagnosis: (same)  Procedure: Left Total Knee Arthroplasty  Components/Implants: Femur: Persona Size 7 CR   Tibia: Persona Size E w/ 14x38mm stem extension  Poly: 13mm MC  Patella: 32x8.40mm symmetric  Femoral Valgus Cut Angle: 5 degrees  Distal Femoral Re-cut: none  Patella Resurfacing: yes   Date of Surgery: 08/15/2024  Surgeon: Arthea Sheer MD  Assistant: Debby Amber PA (present and scrubbed throughout the case, critical for assistance with exposure, retraction, instrumentation, and closure)   Anesthesiologist: Adams  Anesthesia: Spinal   Tourniquet Time: 57 min  EBL: 50cc  IVF: 700cc  Complications: None   Brief history: The patient is a 78 year old female with a history of osteoarthritis of the left knee with pain limiting their range of motion and activities of daily living, which has failed multiple attempts at conservative therapy.  The risks and benefits of total knee arthroplasty as definitive surgical treatment were discussed with the patient, who opted to proceed with the operation.  After outpatient medical clearance and optimization was completed the patient was admitted to [facility] for the procedure.  All preoperative films were reviewed and an appropriate surgical plan was made prior to surgery. Preoperative range of motion was 0 to 120. The patient was identified as having a varus alignment.   Description of procedure: The patient was brought to the operating room where laterality was confirmed by all those present to be the left side.   Spinal anesthesia was administered and the patient received an intravenous dose of antibiotics for surgical prophylaxis and a dose of tranexamic acid.  Patient is positioned supine on the operating room table with all bony prominences well-padded.  A well-padded tourniquet was applied  to the left thigh.  The knee was then prepped and draped in usual sterile fashion with multiple layers of adhesive and nonadhesive drapes.  All of those present in the operating room participated in a surgical timeout laterality and patient were confirmed.   An Esmarch was wrapped around the extremity and the leg was elevated and the knee flexed.  The tourniquet was inflated to a pressure of 250 mmHg. The Esmarch was removed and the leg was brought down to full extension.  The patella and tibial tubercle identified and outlined using a marking pen and a midline skin incision was made with a knife carried through the subcutaneous tissue down to the extensor retinaculum.  After exposure of the extensor mechanism the medial parapatellar arthrotomy was performed with a scalpel and electrocautery extending down medial and distal to the tibial tubercle taking care to avoid incising the patellar tendon.   A standard medial release was performed over the proximal tibia.  The knee was brought into extension in order to excise the fat pad taking care not to damage the patella tendon.  The superior soft tissue was removed from the anterior surface of the distal femur to visualize for the procedure.  The knee was then brought into flexion with the patella subluxed laterally and subluxing the tibia anteriorly.  The ACL was transected and removed with electrocautery and additional soft tissue was removed from the proximal surface of the tibia to fully expose. The PCL was found to be torn and was mostly removed.   An extramedullary tibial cutting guide was then applied to the leg with a spring-loaded ankle clamp placed around the distal tibia just above the malleoli the angulation of the guide was  adjusted to give some posterior slope in the tibial resection with an appropriate varus/valgus alignment.  The resection guide was then pinned to the proximal tibia and the proximal tibial surface was resected with an oscillating  saw.  Careful attention was paid to ensure the blade did not disrupt any of the soft tissues including any lateral or medial ligament.  Attention was then turned to the femur, with the knee slightly flexed a opening drill was used to enter the medullary canal of the femur.  After removing the drill marrow was suctioned out to decompress the distal femur.  An intramedullary femoral guide was then inserted into the drill hole and the alignment guide was seated firmly against the distal end of the medial femoral condyle.  The distal femoral cutting guide was then attached and pinned securely to the anterior surface of the femur and the intramedullary rod and alignment guide was removed.  Distal femur resection was then performed with an oscillating saw with retractors protecting medial and laterally.   The distal cutting block was then removed and the extension gap was checked with a spacer.  Extension gap was found to be appropriately sized to accommodate the spacer block.   The femoral sizing guide was then placed securely into the posterior condyles of the femur and the femoral size was measured and determined to be 7.  The size 7; 4-in-1 cutting guide was placed in position and secured with 2 pins.  The anterior posterior and chamfer resections were then performed with an oscillating saw.  Bony fragments and osteophytes were then removed.  Using a lamina spreader the posterior medial and lateral condyles were checked for additional osteophytes and posterior soft tissue remnants.  Any remaining meniscus was removed at this time.  Periarticular injection was performed in the meniscal rims and posterior capsule with aspiration performed to ensure no intravascular injection.   The tibia was then exposed and the tibial trial was pinned onto the plateau after confirming appropriate orientation and rotation.  Using the drill bushing the tibia was prepared to the appropriate drill depth.  Tibial broach impactor was  then driven through the punch guide using a mallet.  The femoral trial component was then inserted onto the femur. A trial tibial polyethylene bearing was then placed and the knee was reduced.  The knee achieved full extension with no hyperextension and was found to be balanced in flexion and extension with the trials in place.  The knee was then brought into full extension the patella was everted and held with 2 Kocher clamps.  The articular surface of the patella was then resected with an patella reamer and saw after careful measurement with a caliper.  The patella was then prepared with the drill guide and a trial patella was placed.  The knee was then taken through range of motion and it was found that the patella articulated appropriately with the trochlea and good patellofemoral motion without subluxation.    The correct final components for implantation were confirmed and opened by the circulator nurse.  A bone plug was fashioned from the patient's bone cuts to plug the intramedullary femoral canal access hole and was tamped into place.  The prepared surfaces of the patella femur and tibia were cleaned with pulsatile lavage to remove all blood fat and other material and then the surfaces were dried.  2 bags of cement were mixed under vacuum and the components were cemented into place.  Excess cement was removed with curettes and  forceps. A trial polyethylene tibial component was placed and the knee was brought into extension to allow the cement to set.  At this time the periarticular injection cocktail was placed in the soft tissues surrounding the knee.  After full curing of the cement the balance of the knee was checked again and the final polyethylene size was confirmed. The tibial component was irrigated and locking mechanism checked to ensure it was clear of debris. The real polyethylene tibial component was implanted and the knee was brought through a range of motion.   The knee was then irrigated  with copious amount of normal saline via pulsatile lavage to remove all loose bodies and other debris.  The knee was then irrigated with surgiphor betadine based wash and reirrigated with saline.  The tourniquet was then dropped and all bleeding vessels were identified and coagulated.  The arthrotomy was approximated with #1 Vicryl and closed with #1 Stratafix suture.  The knee was brought into slight flexion and the subcutaneous tissues were closed with 0 Vicryl, 2-0 Vicryl and a running subcuticular 4-0 stratafix barbed suture.  Skin was then glued with Dermabond.  A sterile adhesive dressing was then placed along with a sequential compression device to the calf, a Ted stocking, and a cryotherapy cuff.   Sponge, needle, and Lap counts were all correct at the end of the case.   The patient was transferred off of the operating room table to a hospital bed, good pulses were found distally on the operative side.  The patient was transferred to the recovery room in stable condition.

## 2024-08-15 NOTE — Progress Notes (Signed)
 Patient is not able to walk the distance required to go the bathroom, or she is unable to safely negotiate stairs required to access the bathroom.  A 3in1 BSC will alleviate this problem.       Lollie Marrow, PA-C [facility]

## 2024-08-15 NOTE — Evaluation (Signed)
 Physical Therapy Evaluation Patient Details Name: Sarah Harrison MRN: [id number] DOB: [birth year] Today's Date: 08/15/2024  History of Present Illness  Pt is POD 0 after L TKA on day of evaluation (08/15/2024).  Clinical Impression  Pt is pleasant 78 y.o. female who is POD 0 after L TKA. Prior to sx, pt was independent with amb and ADLs without use of AD. Pt has 7STE with rail on the L. Pt is mod I with bed mobility. Pt requires min A for STS transfer and CGA for step-pivot to [facility] and chair with use of RW. Pt able to perform SLR and her sensation is intact. Pt demosntrates deficits in strength/ROM/activity tolerance and will benefit from continued skilled physical therapy to promote return to optimal level of function.         If plan is discharge home, recommend the following: A little help with walking and/or transfers;Help with stairs or ramp for entrance   Can travel by private vehicle        Equipment Recommendations Rolling walker (2 wheels);BSC/3in1  Recommendations for Other Services       Functional Status Assessment Patient has had a recent decline in their functional status and demonstrates the ability to make significant improvements in function in a reasonable and predictable amount of time.     Precautions / Restrictions Precautions Precautions: Fall Recall of Precautions/Restrictions: Intact Restrictions Weight Bearing Restrictions Per Provider Order: Yes LLE Weight Bearing Per Provider Order: Weight bearing as tolerated      Mobility  Bed Mobility Overal bed mobility: Modified Independent             General bed mobility comments: Increased time and effort for supine>sit. No physical assistance needed    Transfers Overall transfer level: Needs assistance Equipment used: Rolling walker (2 wheels) Transfers: Sit to/from Stand, Bed to chair/wheelchair/BSC Sit to Stand: Min assist   Step pivot transfers: Contact guard assist       General  transfer comment: Min A for STS from lowest bed height. Verbal cues for sequencing/hand placement    Ambulation/Gait Ambulation/Gait assistance: Contact guard assist Gait Distance (Feet): 50 Feet Assistive device: Rolling walker (2 wheels) Gait Pattern/deviations: WFL(Within Functional Limits) Gait velocity: dec     General Gait Details: CGA with use of RW. Reports lightheadedness while amb which improves once sitting.  Stairs            Wheelchair Mobility     Tilt Bed    Modified Rankin (Stroke Patients Only)       Balance Overall balance assessment: Needs assistance Sitting-balance support: Feet supported, No upper extremity supported Sitting balance-Leahy Scale: Normal Sitting balance - Comments: Able to maintain seated balance at EOB. Steady reaching outside BOS   Standing balance support: Bilateral upper extremity supported, During functional activity, Reliant on assistive device for balance Standing balance-Leahy Scale: Good Standing balance comment: No LOB with prolonged stand. Appropriate reliance onRW                             Pertinent Vitals/Pain Pain Assessment Pain Assessment: Faces Faces Pain Scale: Hurts little more Pain Location: L knee Pain Descriptors / Indicators: Discomfort Pain Intervention(s): Monitored during session    Home Living Family/patient expects to be discharged to:: Private residence Living Arrangements: Spouse/significant other Available Help at Discharge: Family Type of Home: House Home Access: Stairs to enter Entrance Stairs-Rails: Left Entrance Stairs-Number of Steps: 7   Home Layout: Two  level;Able to live on main level with bedroom/bathroom Home Equipment: Cane - single point;Rollator (4 wheels)      Prior Function Prior Level of Function : Independent/Modified Independent             Mobility Comments: independent without AD. ADLs Comments: independent     Extremity/Trunk Assessment    Upper Extremity Assessment Upper Extremity Assessment: Overall WFL for tasks assessed    Lower Extremity Assessment Lower Extremity Assessment: Overall WFL for tasks assessed    Cervical / Trunk Assessment Cervical / Trunk Assessment: Normal  Communication   Communication Communication: No apparent difficulties    Cognition Arousal: Alert Behavior During Therapy: WFL for tasks assessed/performed   PT - Cognitive impairments: No apparent impairments                       PT - Cognition Comments: Pt is A&Ox4. Pleasant and agreeable to PT Following commands: Intact       Cueing Cueing Techniques: Verbal cues     General Comments      Exercises Total Joint Exercises Goniometric ROM: 7-112   Assessment/Plan    PT Assessment Patient needs continued PT services  PT Problem List Decreased strength;Decreased range of motion;Decreased balance;Decreased mobility;Decreased knowledge of use of DME       PT Treatment Interventions DME instruction;Gait training;Stair training;Functional mobility training;Therapeutic exercise;Balance training;Neuromuscular re-education;Patient/family education    PT Goals (Current goals can be found in the Care Plan section)  Acute Rehab PT Goals Patient Stated Goal: to go home PT Goal Formulation: With patient Time For Goal Achievement: 08/29/24 Potential to Achieve Goals: Good    Frequency BID     Co-evaluation               AM-PAC PT 6 Clicks Mobility  Outcome Measure Help needed turning from your back to your side while in a flat bed without using bedrails?: None Help needed moving from lying on your back to sitting on the side of a flat bed without using bedrails?: A Little Help needed moving to and from a bed to a chair (including a wheelchair)?: A Little Help needed standing up from a chair using your arms (e.g., wheelchair or bedside chair)?: A Little Help needed to walk in hospital room?: A Little Help needed  climbing 3-5 steps with a railing? : A Little 6 Click Score: 19    End of Session Equipment Utilized During Treatment: Gait belt Activity Tolerance: Patient tolerated treatment well Patient left: in chair;with call bell/phone within reach Nurse Communication: Mobility status PT Visit Diagnosis: Difficulty in walking, not elsewhere classified (R26.2)    Time: 8550-8485 PT Time Calculation (min) (ACUTE ONLY): 25 min   Charges:                 Axil Copeman, SPT   Cesily Cuoco 08/15/2024, 3:25 PM

## 2024-08-15 NOTE — Plan of Care (Signed)
   Problem: Activity: Goal: Ability to avoid complications of mobility impairment will improve Outcome: Progressing

## 2024-08-15 NOTE — Discharge Instructions (Signed)
Instructions after Total Knee Replacement   Steffanie Rainwater M.D.     Dept. of [facility]  [place], [place]  [address]  Phone: [phone]   Fax: [phone]    DIET: Drink plenty of non-alcoholic fluids. Resume your normal diet. Include foods high in fiber.  ACTIVITY:  You may use crutches or a walker with weight-bearing as tolerated, unless instructed otherwise. You may be weaned off of the walker or crutches by your Physical Therapist.  Do NOT place pillows under the knee. Anything placed under the knee could limit your ability to straighten the knee.   Continue doing gentle exercises. Exercising will reduce the pain and swelling, increase motion, and prevent muscle weakness.   Please continue to use the TED compression stockings for 2 weeks. You may remove the stockings at night, but should reapply them in the morning. Do not drive or operate any equipment until instructed.  WOUND CARE:  Continue to use the PolarCare or ice packs periodically to reduce pain and swelling. You may begin showering 3 days after surgery with honeycomb dressing. Remove honeycomb dressing 7 days after surgery and continue showering. Allow dermabond to fall off on its own.  MEDICATIONS: You may resume your regular medications. Please take the pain medication as prescribed on the medication. Do not take pain medication on an empty stomach. Do not drive or drink alcoholic beverages when taking pain medications.  POSTOPERATIVE CONSTIPATION PROTOCOL Constipation - defined medically as fewer than three stools per week and severe constipation as less than one stool per week.  One of the most common issues patients have following surgery is constipation.  Even if you have a regular bowel pattern at home, your normal regimen is likely to be disrupted due to multiple reasons following surgery.  Combination of anesthesia, postoperative narcotics,  change in appetite and fluid intake all can affect your bowels.  In order to avoid complications following surgery, here are some recommendations in order to help you during your recovery period.  Colace (docusate) - Pick up an over-the-counter form of Colace or another stool softener and take twice a day as long as you are requiring postoperative pain medications.  Take with a full glass of water daily.  If you experience loose stools or diarrhea, hold the colace until you stool forms back up.  If your symptoms do not get better within 1 week or if they get worse, check with your doctor.  Dulcolax (bisacodyl) - Pick up over-the-counter and take as directed by the product packaging as needed to assist with the movement of your bowels.  Take with a full glass of water.  Use this product as needed if not relieved by Colace only.   MiraLax (polyethylene glycol) - Pick up over-the-counter to have on hand.  MiraLax is a solution that will increase the amount of water in your bowels to assist with bowel movements.  Take as directed and can mix with a glass of water, juice, soda, coffee, or tea.  Take if you go more than two days without a movement. Do not use MiraLax more than once per day. Call your doctor if you are still constipated or irregular after using this medication for 7 days in a row.  If you continue to have problems with postoperative constipation, please contact the office for further assistance and recommendations.  If you experience "the worst abdominal pain ever" or develop nausea or vomiting, please contact the  office immediatly for further recommendations for treatment.   CALL THE OFFICE FOR: Temperature above 101 degrees Excessive bleeding or drainage on the dressing. Excessive swelling, coldness, or paleness of the toes. Persistent nausea and vomiting.  FOLLOW-UP:  You should have an appointment to return to the office in 14 days after surgery. Arrangements have been made for  continuation of Physical Therapy (either home therapy or outpatient therapy).

## 2024-08-15 NOTE — Anesthesia Preprocedure Evaluation (Signed)
 Anesthesia Evaluation  Patient identified by MRN, date of birth, ID band Patient awake    Reviewed: Allergy & Precautions, H&P , NPO status , Patient's Chart, lab work & pertinent test results, reviewed documented beta blocker date and time   History of Anesthesia Complications (+) Family history of anesthesia reaction and history of anesthetic complications  Airway Mallampati: II   Neck ROM: full    Dental  (+) Poor Dentition   Pulmonary asthma , sleep apnea and Continuous Positive Airway Pressure Ventilation , COPD,  COPD inhaler, former smoker   Pulmonary exam normal        Cardiovascular Exercise Tolerance: Poor hypertension, On Medications + CAD  Normal cardiovascular exam+ dysrhythmias  Rhythm:regular Rate:Normal     Neuro/Psych  Headaches PSYCHIATRIC DISORDERS Anxiety Depression     Neuromuscular disease    GI/Hepatic Neg liver ROS,GERD  ,,  Endo/Other  negative endocrine ROS    Renal/GU negative Renal ROS  negative genitourinary   Musculoskeletal   Abdominal   Peds  Hematology negative hematology ROS (+)   Anesthesia Other Findings Past Medical History: No date: Alpha-gal syndrome No date: Anxiety No date: Aortic atherosclerosis No date: Arthritis No date: Asthma 03/23/2023: Baker's cyst of knee, left No date: CAD (coronary artery disease) No date: Chronic gastritis No date: Colon polyps No date: Complication of anesthesia     Comment:  a.) reaction following colonoscopy that is described               as feelings of impending doom No date: COPD (chronic obstructive pulmonary disease) (HCC) No date: Depression No date: Family history of adverse reaction to anesthesia     Comment:  a.) PONV in 1st degree female relative (sister) No date: Fibromyalgia No date: GERD (gastroesophageal reflux disease) No date: Hemangioma of liver No date: Hepatic steatosis No date: HLD (hyperlipidemia) No date:  Hypertension No date: Incomplete RBBB No date: OSA on CPAP No date: Palpitations No date: Post-dural puncture headache following Cesarean No date: Pre-diabetes No date: Right ovarian cyst No date: Seasonal allergic rhinitis No date: Sinus bradycardia No date: Vitamin D deficiency Past Surgical History: 06/22/2023: BIOPSY     Comment:  Procedure: BIOPSY;  Surgeon: Onita Elspeth Sharper, DO;               Location: [facility] ENDOSCOPY;  Service: Gastroenterology;; No date: BREAST BIOPSY; Right     Comment:  benign No date: CESAREAN SECTION No date: CHOLECYSTECTOMY 06/15/2015: COLONOSCOPY WITH PROPOFOL ; N/A     Comment:  Procedure: COLONOSCOPY WITH PROPOFOL ;  Surgeon: Lamar ONEIDA Holmes, MD;  Location: [facility] ENDOSCOPY;  Service:               Endoscopy;  Laterality: N/A; 12/30/2019: COLONOSCOPY WITH PROPOFOL ; N/A     Comment:  Procedure: COLONOSCOPY WITH PROPOFOL ;  Surgeon: Jinny Carmine, MD;  Location: [facility] ENDOSCOPY;  Service:               Endoscopy;  Laterality: N/A; No date: DILATION AND CURETTAGE OF UTERUS 02/11/2022: ESOPHAGOGASTRODUODENOSCOPY; N/A     Comment:  Procedure: ESOPHAGOGASTRODUODENOSCOPY (EGD);  Surgeon:               Onita Elspeth Sharper, DO;  Location: [facility] ENDOSCOPY;                Service: Gastroenterology;  Laterality:  N/A; 06/22/2023: ESOPHAGOGASTRODUODENOSCOPY (EGD) WITH PROPOFOL ; N/A     Comment:  Procedure: ESOPHAGOGASTRODUODENOSCOPY (EGD) WITH               PROPOFOL ;  Surgeon: Onita Elspeth Sharper, DO;  Location:              [facility] ENDOSCOPY;  Service: Gastroenterology;  Laterality:               N/A; No date: HAND SURGERY; Right No date: NEUROMA SURGERY No date: urethrel dialation     Comment:  age 78 BMI    Body Mass Index: 27.29 kg/m     Reproductive/Obstetrics negative OB ROS                              Anesthesia Physical Anesthesia Plan  ASA: 3  Anesthesia Plan: General   Post-op Pain  Management:    Induction:   PONV Risk Score and Plan:   Airway Management Planned:   Additional Equipment:   Intra-op Plan:   Post-operative Plan:   Informed Consent: I have reviewed the patients History and Physical, chart, labs and discussed the procedure including the risks, benefits and alternatives for the proposed anesthesia with the patient or authorized representative who has indicated his/her understanding and acceptance.     Dental Advisory Given  Plan Discussed with: CRNA  Anesthesia Plan Comments:         Anesthesia Quick Evaluation

## 2024-08-15 NOTE — Anesthesia Procedure Notes (Signed)
 Spinal  Patient location during procedure: OR Start time: 08/15/2024 7:35 AM End time: 08/15/2024 7:40 AM Reason for block: surgical anesthesia Staffing Performed: resident/CRNA  Resident/CRNA: Trudy Rankin LABOR, CRNA Performed by: Trudy Rankin LABOR, CRNA Authorized by: Myra Lynwood MATSU, MD   Preanesthetic Checklist Completed: patient identified, IV checked, site marked, risks and benefits discussed, surgical consent, monitors and equipment checked, pre-op evaluation and timeout performed Spinal Block Patient position: sitting Prep: DuraPrep Patient monitoring: heart rate, cardiac monitor, continuous pulse ox and blood pressure Approach: midline Location: L3-4 Injection technique: single-shot Needle Needle type: Quincke  Needle gauge: 22 G Needle length: 9 cm Assessment Sensory level: T4 Events: CSF return Additional Notes 1st attempt 24ga Pencan with introducer. Os encountered. Unsuccessful. 2nd attempt 22ga quincke no introducer = successful. Pt tolerated well

## 2024-08-15 NOTE — Interval H&P Note (Signed)
 Patient history and physical updated. Consent reviewed including risks, benefits, and alternatives to surgery. Patient agrees with above plan to proceed with left total knee arthroplasty.

## 2024-08-15 NOTE — Plan of Care (Signed)
 Patient verbalizes understanding of procedure, plan of care and discharge instructions

## 2024-08-15 NOTE — Transfer of Care (Signed)
 Immediate Anesthesia Transfer of Care Note  Patient: Sarah Harrison  Procedure(s) Performed: ARTHROPLASTY, KNEE, TOTAL (Left: Knee)  Patient Location: PACU  Anesthesia Type:Spinal  Level of Consciousness: awake and oriented  Airway & Oxygen Therapy: Patient Spontanous Breathing  Post-op Assessment: Report given to RN and Post -op Vital signs reviewed and stable  Post vital signs: Reviewed and stable  Last Vitals:  Vitals Value Taken Time  BP 138/65 08/15/24 09:38  Temp    Pulse 74 08/15/24 09:39  Resp 11 08/15/24 09:39  SpO2 99 % 08/15/24 09:39  Vitals shown include unfiled device data.  Last Pain:  Vitals:   08/15/24 0629  TempSrc: Temporal  PainSc: 0-No pain         Complications: No notable events documented.

## 2024-08-16 ENCOUNTER — Other Ambulatory Visit: Payer: Self-pay

## 2024-08-16 ENCOUNTER — Encounter: Payer: Self-pay | Admitting: Orthopedic Surgery

## 2024-08-16 DIAGNOSIS — M1712 Unilateral primary osteoarthritis, left knee: Secondary | ICD-10-CM | POA: Diagnosis not present

## 2024-08-16 LAB — BASIC METABOLIC PANEL WITH GFR
Anion gap: 5 (ref 5–15)
BUN: 14 mg/dL (ref 8–23)
CO2: 27 mmol/L (ref 22–32)
Calcium: 8.8 mg/dL — ABNORMAL LOW (ref 8.9–10.3)
Chloride: 108 mmol/L (ref 98–111)
Creatinine, Ser: 0.65 mg/dL (ref 0.44–1.00)
GFR, Estimated: >60 mL/min (ref >60)
Glucose, Bld: 128 mg/dL — ABNORMAL HIGH (ref 70–99)
Potassium: 4.2 mmol/L (ref 3.5–5.1)
Sodium: 140 mmol/L (ref 135–145)

## 2024-08-16 LAB — CBC
HCT: 37.6 % (ref 36.0–46.0)
Hemoglobin: 12.1 g/dL (ref 12.0–15.0)
MCH: 31 pg (ref 26.0–34.0)
MCHC: 32.2 g/dL (ref 30.0–36.0)
MCV: 96.4 fL (ref 80.0–100.0)
Platelets: 210 K/uL (ref 150–400)
RBC: 3.9 MIL/uL (ref 3.87–5.11)
RDW: 12.5 % (ref 11.5–15.5)
WBC: 13.9 K/uL — ABNORMAL HIGH (ref 4.0–10.5)
nRBC: 0 % (ref 0.0–0.2)

## 2024-08-16 NOTE — Progress Notes (Signed)
 Physical Therapy Treatment Patient Details Name: Sarah Harrison MRN: [id number] DOB: Sep 19, 1946 Today's Date: 08/16/2024   History of Present Illness Pt is POD 0 after L TKA on day of evaluation (08/15/2024).    PT Comments  Pt able to perform printed HEP with verbal/visual cuing for technique. Pt demonstrated understanding of safe use of RW with SBA for safety; pt ambulated 271ft using RW with SBA. Pt performs stairs using step-to technique with SBA for safety and did not require cuing. Pt will benefit from continued skilled physical therapy to promote optimal level of function with focus on strength/ROM/balance.    If plan is discharge home, recommend the following: A little help with walking and/or transfers;Help with stairs or ramp for entrance   Can travel by private vehicle        Equipment Recommendations  Rolling walker (2 wheels);BSC/3in1    Recommendations for Other Services       Precautions / Restrictions Precautions Precautions: Fall Recall of Precautions/Restrictions: Intact Restrictions Weight Bearing Restrictions Per Provider Order: Yes LLE Weight Bearing Per Provider Order: Weight bearing as tolerated     Mobility  Bed Mobility               General bed mobility comments: NT. Pt received in recliner pre/post session    Transfers Overall transfer level: Needs assistance Equipment used: Rolling walker (2 wheels) Transfers: Sit to/from Stand, Bed to chair/wheelchair/BSC Sit to Stand: Supervision   Step pivot transfers: Contact guard assist       General transfer comment: Hand placement in appropriate locations without cuing. SBA for safety.    Ambulation/Gait Ambulation/Gait assistance: Contact guard assist Gait Distance (Feet): 200 Feet Assistive device: Rolling walker (2 wheels) Gait Pattern/deviations: WFL(Within Functional Limits) Gait velocity: dec     General Gait Details: CGA for safety   Stairs Stairs: Yes Stairs  assistance: Supervision Stair Management: Step to pattern, One rail Right Number of Stairs: 4 General stair comments: No cues needed. Pt able to ascend/descend stairs safely   Wheelchair Mobility     Tilt Bed    Modified Rankin (Stroke Patients Only)       Balance Overall balance assessment: Needs assistance Sitting-balance support: Feet supported, No upper extremity supported Sitting balance-Leahy Scale: Normal Sitting balance - Comments: Able to maintain seated balance at EOB. Steady reaching outside BOS   Standing balance support: Bilateral upper extremity supported, During functional activity, Reliant on assistive device for balance Standing balance-Leahy Scale: Good Standing balance comment: No LOB with static stand and use of RW.                            Communication Communication Communication: No apparent difficulties  Cognition Arousal: Alert Behavior During Therapy: WFL for tasks assessed/performed   PT - Cognitive impairments: No apparent impairments                       PT - Cognition Comments: pleasant and agreeable to PT Following commands: Intact      Cueing Cueing Techniques: Verbal cues  Exercises Other Exercises Other Exercises: Review of printed HEP: ankle pumps, LAQ, knee flexion, hip abduction, quad sets, SAQ    General Comments        Pertinent Vitals/Pain Pain Assessment Pain Assessment: 0-10 Pain Score: 3  Pain Location: L knee Pain Descriptors / Indicators: Discomfort Pain Intervention(s): Monitored during session, Premedicated before session    Home Living  Prior Function            PT Goals (current goals can now be found in the care plan section) Acute Rehab PT Goals Patient Stated Goal: to go home PT Goal Formulation: With patient Time For Goal Achievement: 08/29/24 Potential to Achieve Goals: Good Progress towards PT goals: Progressing toward goals     Frequency    BID      PT Plan      Co-evaluation              AM-PAC PT 6 Clicks Mobility   Outcome Measure  Help needed turning from your back to your side while in a flat bed without using bedrails?: None Help needed moving from lying on your back to sitting on the side of a flat bed without using bedrails?: A Little Help needed moving to and from a bed to a chair (including a wheelchair)?: A Little Help needed standing up from a chair using your arms (e.g., wheelchair or bedside chair)?: A Little Help needed to walk in hospital room?: A Little Help needed climbing 3-5 steps with a railing? : A Little 6 Click Score: 19    End of Session   Activity Tolerance: Patient tolerated treatment well Patient left: in chair;with call bell/phone within reach Nurse Communication: Mobility status PT Visit Diagnosis: Difficulty in walking, not elsewhere classified (R26.2)     Time: 9089-9064 PT Time Calculation (min) (ACUTE ONLY): 25 min  Charges:                            Delanee Xin, SPT    W[place] Plitt 08/16/2024, 11:19 AM

## 2024-08-16 NOTE — Progress Notes (Signed)
   Subjective: 1 Day Post-Op Procedure(s) (LRB): ARTHROPLASTY, KNEE, TOTAL (Left) Patient reports pain as mild.   Patient is well, and has had no acute complaints or problems Denies any CP, SOB, ABD pain. We will continue therapy today.  Plan is to go Home after hospital stay.  Objective: Vital signs in last 24 hours: Temp:  [97.3 F (36.3 C)-98.6 F (37 C)] 97.3 F (36.3 C) (10/17 0350) Pulse Rate:  [57-79] 61 (10/17 0350) Resp:  [10-19] 12 (10/17 0350) BP: (102-143)/(43-106) 136/61 (10/17 0350) SpO2:  [92 %-99 %] 97 % (10/17 0350)  Intake/Output from previous day: 10/16 0701 - 10/17 0700 In: 1383.5 [P.O.:325; I.V.:973.3; IV Piggyback:85.2] Out: -  Intake/Output this shift: No intake/output data recorded.  Recent Labs    08/16/24 0631  HGB 12.1   Recent Labs    08/16/24 0631  WBC 13.9*  RBC 3.90  HCT 37.6  PLT 210   Recent Labs    08/16/24 0631  NA 140  K 4.2  CL 108  CO2 27  BUN 14  CREATININE 0.65  GLUCOSE 128*  CALCIUM 8.8*   No results for input(s): LABPT, INR in the last 72 hours.  EXAM General - Patient is Alert, Appropriate, and Oriented Extremity - Neurovascular intact Sensation intact distally Intact pulses distally Dorsiflexion/Plantar flexion intact Dressing - dressing C/D/I and no drainage Motor Function - intact, moving foot and toes well on exam.   Past Medical History:  Diagnosis Date   Alpha-gal syndrome    Anxiety    Aortic atherosclerosis    Arthritis    Asthma    Baker's cyst of knee, left 03/23/2023   CAD (coronary artery disease)    Chronic gastritis    Colon polyps    Complication of anesthesia    a.) reaction following colonoscopy that is described as feelings of impending doom   COPD (chronic obstructive pulmonary disease) (HCC)    Depression    Family history of adverse reaction to anesthesia    a.) PONV in 1st degree female relative (sister)   Fibromyalgia    GERD (gastroesophageal reflux disease)     Hemangioma of liver    Hepatic steatosis    HLD (hyperlipidemia)    Hypertension    Incomplete RBBB    OSA on CPAP    Palpitations    Post-dural puncture headache following Cesarean    Pre-diabetes    Right ovarian cyst    Seasonal allergic rhinitis    Sinus bradycardia    Vitamin D deficiency     Assessment/Plan:   1 Day Post-Op Procedure(s) (LRB): ARTHROPLASTY, KNEE, TOTAL (Left) Principal Problem:   S/P TKR (total knee replacement), left  Estimated body mass index is 27.29 kg/m as calculated from the following:   Height as of this encounter: 5' 4 (1.626 m).   Weight as of this encounter: 72.1 kg. Advance diet Up with therapy Pain well controlled Labs and VSS CM to assist with discharge to home with HHPT   DVT Prophylaxis - TEDs, SCDs, ELiquis Weight-Bearing as tolerated to left leg   T. Medford Amber, PA-C [facility] 08/16/2024, 7:54 AM

## 2024-08-16 NOTE — Progress Notes (Signed)
 Patient meet criteria to discharge safely home. DME delivered to bedside, along with prescription medication from outpatient pharmacy. Son and husband present for discharge instructions.

## 2024-08-16 NOTE — Plan of Care (Signed)
 See chart

## 2024-08-16 NOTE — Anesthesia Postprocedure Evaluation (Signed)
 Anesthesia Post Note  Patient: KEYLEE SHRESTHA  Procedure(s) Performed: ARTHROPLASTY, KNEE, TOTAL (Left: Knee)  Patient location during evaluation: PACU Anesthesia Type: General Level of consciousness: awake and alert Pain management: pain level controlled Vital Signs Assessment: post-procedure vital signs reviewed and stable Respiratory status: spontaneous breathing, nonlabored ventilation, respiratory function stable and patient connected to nasal cannula oxygen Cardiovascular status: blood pressure returned to baseline and stable Postop Assessment: no apparent nausea or vomiting Anesthetic complications: no   No notable events documented.   Last Vitals:  Vitals:   08/16/24 0350 08/16/24 0756  BP: 136/61 (!) 147/62  Pulse: 61 (!) 59  Resp: 12 12  Temp: (!) 36.3 C 36.7 C  SpO2: 97% 94%    Last Pain:  Vitals:   08/16/24 0802  TempSrc:   PainSc: 4                  [place] KANDICE Clause
